# Patient Record
Sex: Female | Born: 1985 | Race: White | Hispanic: No | Marital: Married | Smoking: Current every day smoker
Health system: Southern US, Community
[De-identification: ages and names within clinical notes are randomized; demographics above are authoritative.]

## PROBLEM LIST (undated history)

## (undated) DIAGNOSIS — G43909 Migraine, unspecified, not intractable, without status migrainosus: Secondary | ICD-10-CM

## (undated) DIAGNOSIS — F909 Attention-deficit hyperactivity disorder, unspecified type: Secondary | ICD-10-CM

## (undated) HISTORY — PX: CHOLECYSTECTOMY: SHX55

## (undated) HISTORY — PX: TUBAL LIGATION: SHX77

---
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
## 2004-09-17 ENCOUNTER — Emergency Department (HOSPITAL_COMMUNITY): Admission: EM | Admit: 2004-09-17 | Discharge: 2004-09-18 | Payer: Self-pay | Admitting: Emergency Medicine

## 2005-01-11 ENCOUNTER — Emergency Department (HOSPITAL_COMMUNITY): Admission: EM | Admit: 2005-01-11 | Discharge: 2005-01-11 | Payer: Self-pay | Admitting: Emergency Medicine

## 2007-02-09 ENCOUNTER — Other Ambulatory Visit: Admission: RE | Admit: 2007-02-09 | Discharge: 2007-02-09 | Payer: Self-pay | Admitting: Obstetrics and Gynecology

## 2007-07-25 ENCOUNTER — Inpatient Hospital Stay (HOSPITAL_COMMUNITY): Admission: AD | Admit: 2007-07-25 | Discharge: 2007-07-25 | Payer: Self-pay | Admitting: Family Medicine

## 2007-07-25 ENCOUNTER — Ambulatory Visit: Payer: Self-pay | Admitting: *Deleted

## 2007-08-13 ENCOUNTER — Inpatient Hospital Stay (HOSPITAL_COMMUNITY): Admission: AD | Admit: 2007-08-13 | Discharge: 2007-08-13 | Payer: Self-pay | Admitting: Obstetrics & Gynecology

## 2007-08-13 ENCOUNTER — Ambulatory Visit: Payer: Self-pay | Admitting: Physician Assistant

## 2007-08-25 ENCOUNTER — Other Ambulatory Visit: Payer: Self-pay | Admitting: Obstetrics and Gynecology

## 2007-08-28 ENCOUNTER — Inpatient Hospital Stay (HOSPITAL_COMMUNITY): Admission: AD | Admit: 2007-08-28 | Discharge: 2007-08-31 | Payer: Self-pay | Admitting: Family Medicine

## 2007-08-28 ENCOUNTER — Ambulatory Visit: Payer: Self-pay | Admitting: Family Medicine

## 2007-10-17 ENCOUNTER — Emergency Department (HOSPITAL_COMMUNITY): Admission: EM | Admit: 2007-10-17 | Discharge: 2007-10-17 | Payer: Self-pay | Admitting: Emergency Medicine

## 2009-12-14 ENCOUNTER — Ambulatory Visit (HOSPITAL_COMMUNITY): Admission: RE | Admit: 2009-12-14 | Discharge: 2009-12-14 | Payer: Self-pay | Admitting: Internal Medicine

## 2010-07-16 NOTE — H&P (Signed)
NAME:  [name], [name]            ACCOUNT NO.:  [id number]   MEDICAL RECORD NO.:  [id number]          PATIENT TYPE:  INP   LOCATION:                                [facility]   PHYSICIAN:  Tilda Burrow, M.D. DATE OF BIRTH:  1986/02/13   DATE OF ADMISSION:  DATE OF DISCHARGE:                              HISTORY & PHYSICAL   ADMITTING DIAGNOSIS:  Pregnancy at [redacted] weeks gestation, repeat C-section,  not for trial of labor, elective permanent sterilization.   HISTORY OF PRESENT ILLNESS:  This 25 year old female gravida 3, para 2-0-  0-2, LMP September 29, 2006, plus [facility] September 06, 2007, was admitted at [redacted] weeks  gestation for repeat low-transverse cervical cesarean section.  She also  desires permanent sterilization, tubal ligation has been reviewed.  The  patient is admitted for repeat C-section.  Prior cesarean section at 37  weeks was after a failed induction of labor for pregnancy-induced  hypertension.  That C-section was complicated by severely impacted head  into the pelvis with extension of the incision upward performed  surgically and an inferior laceration of the cervix occurring as well.  This was all repaired without any additional sequelae, but has noted for  planning the surgery at this time.   PAST MEDICAL HISTORY:  Benign.   SURGICAL HISTORY:  Negative.   ALLERGIES:  None other than PENICILLIN, questionable PENICILLIN allergy  causing a rash.  We intent to use beta-lactam antibiotic at the time of  surgery.  She has no allergies to latex.   PRIOR SURGERIES:  C-section in 2003 at [facility], in 2008 in  [place].   Pupils equal, round, and reactive.  Extraocular movements intact.  Neck  supple.  Chest clear to auscultation.  Abdomen gravid uterus 38-cm  fundal height, vertex presentation, cervix long, closed at last check  August 23, 2007.   PLAN:  A repeat C-section at 11:30 a.m. on August 30, 2007.      Tilda Burrow, M.D.  Electronically  Signed     JVF/MEDQ  D:  08/24/2007  T:  08/25/2007  Job:  [id number]

## 2010-07-16 NOTE — Op Note (Signed)
[name], [name]            ACCOUNT NO.:  [id number]   MEDICAL RECORD NO.:  [id number]          PATIENT TYPE:  INP   LOCATION:  9199                          [facility]ACILITY:  [facility]   PHYSICIAN:  Tanya S. Shawnie Pons, M.D.   DATE OF BIRTH:  Jul 09, 1985   DATE OF PROCEDURE:  08/29/2007  DATE OF DISCHARGE:                               OPERATIVE REPORT   PREOPERATIVE DIAGNOSES:  1. Intrauterine pregnancy at 38 plus weeks, active labor.  2. Previous C-section x2.  3. Desires sterility.   POSTOPERATIVE DIAGNOSES:  1. Intrauterine pregnancy at 38 plus weeks, active labor.  2. Previous C-section x2.  3. Desires sterility.   PROCEDURE:  Repeat low-transverse cesarean section and bilateral tubal  ligation with Filshie clip.   SURGEON:  Shelbie Proctor. Shawnie Pons, MD   ASSISTANT:  None.   ANESTHESIA:  Spinal local, Dr. Angelica Pou.   FINDINGS:  Viable female infant, Apgars 9 and 9, and weight 6 pounds 14  ounces.   SPECIMENS:  Placenta to labor and delivery.   ESTIMATED BLOOD LOSS:  1000 mL.   COMPLICATIONS:  None known.   REASON FOR PROCEDURE:  Briefly, the patient is a 25 year old gravida 3,  para 2 who is at 63 weeks' and was scheduled for repeat C-section on  Monday who came in an active labor overnight.  She was watched for some  time and change her cervix from 1-3 and so was taken for delivery.  The  patient has a history of 2 prior C-sections.   PROCEDURE:  The patient was taken to the OR where she was placed in  supine position left lateral tilt.  She was prepped and draped in usual  sterile fashion.  A Foley catheter placed at the bladder.  When  anesthesia was felt to be adequate, Pfannenstiel made through old scar.  This incision was carried down to the underlying fascia, which was  divided in the midline.  The fascia was dissected off the rectus muscles  superiorly and inferiorly, bluntly laterally and sharply in the midline.  The peritoneal cavity was then entered high.  The  bladder was noted to  be high also.  More room was created by traversing the rectus muscle  anteriorly and there was some omentum attached there and this was taken  down.  Alexis retractor was then placed inside the incision.  A low-  transverse incision was made on the uterus above the bladder.  The  incision was carried down to amniotic cavity, which was entered sharply.  Clear fluid was noted.  The uterine incision was extended bluntly  laterally.  The infant was vertex.  There was a nuchal cord times 1.  Infant was delivered out of the incision easily.  Infant was bulb  suctioned on the abdomen.  Spontaneous cry was heard.  Cord was clamped  x2 and taken to awaiting peds.  Cord blood was obtained.  Placenta was  delivered from the uterus.  The uterine cavity cleaned with a dry lap  pad.  Edges of the uterine incisions were grasped with ring forceps.  The uterine incision closed with  a locked running 0 Vicryl suture.  Excellent hemostasis was noted.  Attention was turned to the tubes.  The  patient's left tube was grasped with a Babcock clamp followed to its  fimbriated end.  A Filshie clip was placed approximately 2-cm from the  cornua.  Similarly, the right tube was grasped with a Babcock clamp  followed to its fimbriated end and Filshie clip placed 2 cm from the  cornu of well.  Inspection of the uterine incision showed the peritoneal  edge or serosal edge bleeding, which was cauterized with electrocautery.  Otherwise , hemostasis was adequate.  All Jon Gills was removed from the  abdomen.  The fascia was closed with a 0 Vicryl suture in a running  fashion.  Subcutaneous tissue was irrigated.  Any bleeders were  cauterized with electrocautery.  Skin closed using clips.  A 30 mL 0.25%  Marcaine were injected about the incision.  A pressure dressing was  applied.  All instrument, lap counts correct x2.  The patient was awake  and taken to recovery room in stable condition.  Infant went to  NICU,  also stable.      Shelbie Proctor. Shawnie Pons, M.D.  Electronically Signed     TSP/MEDQ  D:  08/29/2007  T:  08/29/2007  Job:  [id number]

## 2010-07-19 NOTE — Op Note (Signed)
[name], [name]                    ACCOUNT NO.:  [id number]   MEDICAL RECORD NO.:  [id number]                   PATIENT TYPE:  AMB   LOCATION:  DAY                                  FACILITY:  [facility]   PHYSICIAN:  Dalia Heading, M.D.               DATE OF BIRTH:  10-May-1985   DATE OF PROCEDURE:  DATE OF DISCHARGE:                                 OPERATIVE REPORT   PREOPERATIVE DIAGNOSIS:  Cholecystitis, cholelithiasis.   POSTOPERATIVE DIAGNOSIS:  Cholecystitis, cholelithiasis.   PROCEDURE:  Laparoscopic cholecystectomy   SURGEON:  Dalia Heading, M.D.   ASSISTANT:  Bernerd Limbo. Leona Carry, M.D.   ANESTHESIA:  General endotracheal   INDICATIONS:  The patient is a 25 year old white female who is 6 weeks  postpartum who presents with acute cholecystitis secondary to  cholelithiasis.  Her initial blood tests showed a total bilirubin of 9  though a repeat today shows total bilirubin of 0.4.  It is suspected that  her labs done 2 days ago were in error. Ultrasound reveals cholelithiasis  with a normal common bile duct.  The patient now comes to the operating room  for a laparoscopic cholecystectomy.  The risks and benefits of the procedure  including bleeding, infection, hepatobiliary injury and the possibility of  an open procedure were fully explained to the patient, and mother, who gave  informed consent for the patient as the patient is a minor.   DESCRIPTION OF PROCEDURE:  The patient was placed in the supine position.  After induction of general endotracheal anesthesia, the abdomen was prepped  and draped using the usual sterile technique with Betadine.  Surgical site  confirmation was performed.   An infraumbilical incision was made down to the fascia.  A Veress needle was  introduced into the abdominal cavity and confirmation of placement was done  using the saline drop test.  The abdomen was then insufflated to 16 mmHg  pressure.  An 11-mm trocar was introduced  into the abdominal cavity under  direct visualization without difficulty.  The patient was placed then in  reverse Trendelenburg position and an additional 11-mm trocar was placed in  the epigastric region and 5-mm trocars were placed in the right upper  quadrant and right flank regions. The liver was inspected and noted to be  within normal limits.  The gallbladder was retracted superiorly and  laterally.  The dissection was begun around the infundibulum of the  gallbladder.   The cystic duct was first identified.  Its junction to the infundibulum  fully identified.  Endoclips were placed proximally and distally on the  cystic duct; and the cystic duct was divided.  This was likewise done on the  cystic artery.  The gallbladder was then freed away from the gallbladder  fossa using Bovie electrocautery.  The gallbladder was delivered through the  epigastric trocar site using an EndoCatch bag without difficulty.  The  gallbladder fossa was inspected  and no abnormal bleeding or bile leakage was  noted.  Surgicel was placed in the gallbladder fossa.  All fluid and air  were then evacuated from the abdominal cavity prior to removal of the  trocars.   All wounds were irrigated with normal saline.  All wounds were injected with  0.5% Sensorcaine.  The infraumbilical fascia were reapproximated using an #0  Vicryl interrupted suture. All skin incisions were closed using staples.  Betadine ointment and dry sterile dressings were applied.   All tape and needle counts correct at the end of the procedure.  The patient  was extubated in the operating room and went back to recovery room in awake  and stable condition.   COMPLICATIONS:  None.   SPECIMENS:  Gallbladder with stones.   BLOOD LOSS:  Minimal.                                               Dalia Heading, M.D.    MAJ/MEDQ  D:  02/16/2002  T:  02/16/2002  Job:  [id number]   cc:   Mila Homer. Sudie Bailey, M.D.  [address] [place], [place] [address]  Fax: [phone]   Tilda Burrow, M.D.  [address] [place]  [place] [address]  Fax: [phone]

## 2010-07-19 NOTE — H&P (Signed)
[name], [name]                      ACCOUNT NO.:  [id number]   MEDICAL RECORD NO.:  [id number]                   PATIENT TYPE:   LOCATION:                                       FACILITY:  [facility]   PHYSICIAN:  Lazaro Arms, M.D.                DATE OF BIRTH:  1986/02/23   DATE OF ADMISSION:  DATE OF DISCHARGE:                                HISTORY & PHYSICAL   HISTORY OF PRESENT ILLNESS:  The patient is a 25 year old white female  gravida 1, para 0, abortus 0 with estimated date of delivery of January 21, 2002 by a sure last menstrual period and confirmatory 20 week sonogram  currently at 66 and 1/[redacted] weeks gestation.  The patient came into the hospital  a couple days ago complaining of diminished fetal movement.  Had a  beautifully reactive NST, but was found to have an elevated blood pressure.  I actually kept her overnight for observation and her blood pressure settled  down.  I did a 24-hour urine and laboratories.  All of her laboratories were  fine.  Her 24-hour urine also came back with only 127 mg of protein in 24  hours.  She comes in this morning as scheduled by me for a repeat prenatal  visit.  Her blood pressure is 140/110 left, 140/100 in the right and her  protein is negative.  She has 1+ edema.  DTRs are fine.  Her examination is  otherwise fine.  Her cervix is 2 cm, 50% effaced, soft, -1 station, mid  plane, and vertex.  Discussed with Dr. Emelda Fear who agrees that delivery  would be appropriate.  As a result, she is sent over to [facility]  for Pitocin induction of labor.   PAST MEDICAL HISTORY:  Negative.   PAST SURGICAL HISTORY:  Negative.   PAST OB:  She is nulliparous.   ALLERGIES:  None.   MEDICATIONS:  Vitamins.   REVIEW OF SYSTEMS:  Otherwise negative.   LABORATORIES:  Blood type O+.  Antibody screen is negative.  Serology is  nonreactive.  HIV is negative.  Hepatitis B is negative.  Rubella is  equivocal.  Pap was normal.   GC/Chlamydia were negative.  AFP was normal.  Glucola was elevated 176 but her three hour GTT was normal.  She is group B  Strep positive.   PHYSICAL EXAMINATION:  HEENT:  Unremarkable.  NECK:  Thyroid is normal.  LUNGS:  Clear.  BREASTS:  Deferred.  ABDOMEN:  Fundal height is 35 cm.  PELVIC:  Cervix 2, 50, -1, vertex, soft, mid plane.  EXTREMITIES:  1+ edema.  NEUROLOGIC:  Grossly intact.    IMPRESSION:  1. Intrauterine pregnancy at [redacted] weeks gestation.  2. Gestational hypertension.   PLAN:  With favorable cervix patient admitted for induction of labor.  She  understands her increased risk of cesarean section because of induction but  with her  blood pressure we feel that is the most appropriate management.  She will be managed by Dr. Emelda Fear in the hospital.                                               Lazaro Arms, M.D.    Loraine Maple  D:  12/31/2001  T:  12/31/2001  Job:  [id number]

## 2010-07-19 NOTE — Op Note (Signed)
[name], [name]                      ACCOUNT NO.:  [id number]   MEDICAL RECORD NO.:  [id number]                   PATIENT TYPE:  INP   LOCATION:  A428                                 FACILITY:  [facility]   PHYSICIAN:  Tilda Burrow, M.D.              DATE OF BIRTH:  [birth year]   DATE OF PROCEDURE:  01/02/2002  DATE OF DISCHARGE:                                 OPERATIVE REPORT   PREOPERATIVE DIAGNOSES:  1. Pregnancy at 37 weeks.  2. Pregnancy-induced hypertension.  3. Cephalopelvic disproportion.   POSTOPERATIVE DIAGNOSES:  1. Pregnancy at 37 weeks.  2. Pregnancy-induced hypertension.  3. Cephalopelvic disproportion.   PROCEDURE:  Primary low transverse cervical cesarean section.   SURGEON:  Tilda Burrow, M.D.   ASSISTANT:  Pelvic CST.   ANESTHESIA:  Epidural, Ferguson/Waller.   ESTIMATED BLOOD LOSS:  500 cc.   COMPLICATIONS:  Severely impacted head into the pelvis necessitating  extension of the incision upward to allow elevation of the head, with  inferior laceration of the cervix as well.  The extension was approximately  2 cm cephalad by surgical incision and 2 cm inferior by laceration of the  lower uterine segment.   DESCRIPTION OF PROCEDURE:  The patient was taken to the operating room and  prepped and draped for lower abdominal surgery with epidural catheter  confirmed as effective.  A Pfannenstiel-type incision was performed and the  bladder flap developed onto the lower uterine segment, which was well-  developed.  The bladder flap was pulled inferiorly, a transverse uterine  made, and when at the level of approximately the fetal jaw, the vertex was  in the right occiput transverse position.  The flat symphysis pubis made it  technically challenging to get the surgeon's hand beneath the fetal vertex,  so an assistant from below pushed up on the head and further effort was made  to elevate the vertex.  We made a vertical T-type extension of the  incision  approximately 2 cm, which allowed [place] to push up onto the shoulder until the  seal was broken by the combination of shoulder elevation and head elevation.  Then the operator's hand could be slipped beneath the vertex, which was then  rotated in the incision and the infant delivered by fundal pressure.  The  baby also required a brief bit of vacuum extractor application for the  occiput to steer the vertex through the incision without difficulty.  There  was minimal traction required at this time.  The infant promptly, had bulb  suctioning of the pharynx performed.  Amniotic fluid was clear without  malodor.  The cord was clamped and the infant passed to Dr. Latrelle Dodrill, the  pediatrician in attendance.   The infant weighed 7.5 3.5 ounces, and Apgars were 8 and 9.   After delivery of the infant and cord blood sample collection, we proceeded  to deliver the placenta, [facility] presentation, and then irrigate  the uterus  with antibiotic solution, followed by interrupted closure of the inferior  and superior lacerations.  The superior T-cut in the low uterine segment was  approximately 2 cm in length and was closed without difficulty.  The  inferior laceration was more irregular, occurred just to the left of the  midline, and required running locking closure.  The Pfannenstiel incision  was then closed in a single-layer running locking closure with good  hemostasis.  The bladder flap was loosely reapproximated with 2-0 chromic.   The abdomen was irrigated with antibiotic solution and anterior peritoneum  closed with 2-0 chromic, the fascia closed with 0 Vicryl, the subcu tissue  was irrigated to confirm that it was hemostatic and closed with interrupted  2-0 plain.  Staple closure of the skin completed the procedure.  At the end  of the procedure, the patient was placed on her stretcher to leave the room  and the epidural catheter removed with tip visualized as intact.                                                 Tilda Burrow, M.D.    JVF/MEDQ  D:  01/02/2002  T:  01/03/2002  Job:  [id number]

## 2010-07-19 NOTE — Discharge Summary (Signed)
   NAMESHADARA, LOPEZ                      ACCOUNT NO.:  [id number]   MEDICAL RECORD NO.:  [id number]                   PATIENT TYPE:  INP   LOCATION:  A428                                 FACILITY:  [facility]   PHYSICIAN:  Lazaro Arms, M.D.                DATE OF BIRTH:  03/09/1985   DATE OF ADMISSION:  12/31/2001  DATE OF DISCHARGE:  01/04/2002                                 DISCHARGE SUMMARY   DISCHARGE DIAGNOSES:  1. Status post a cesarean section secondary to cephalopelvic disproportion.  2. Pregnancy induced hypertension.   PROCEDURES:  1. Induction of labor.  2. Cesarean section.   Please refer to the transcribed History and Physical for details of  admission to the hospital.   HOSPITAL COURSE:  The patient was admitted, underwent ripening with Cervidil  vaginal medicine.  She then underwent Pitocin induction which failed.  On  the evening of January 02, 2002 the patient underwent her primary cesarean  section for secondary arrest of dilatation and descent.  It was done under a  spinal anesthetic.  It went without difficulty.  Postoperatively the patient  did well.  Her blood pressure resolved.  She was discharged to home  postoperative day #2, tolerating a regular diet, voiding without symptoms,  ambulatory, passing flatus, and tolerating oral pain medicine.  She is to be  seen in the office as scheduled to have her staples removed and Steri-Strips  placed.                                               Lazaro Arms, M.D.    Loraine Maple  D:  02/10/2002  T:  02/10/2002  Job:  [id number]

## 2010-07-19 NOTE — Discharge Summary (Signed)
NAMECARMELITE, [name]            ACCOUNT NO.:  [id number]   MEDICAL RECORD NO.:  [id number]          PATIENT TYPE:  INP   LOCATION:  9106                          [facility]ACILITY:  [facility]   PHYSICIAN:  Allie Bossier, MD        DATE OF BIRTH:  [birth year]   DATE OF ADMISSION:  08/28/2007  DATE OF DISCHARGE:  08/31/2007                               DISCHARGE SUMMARY   CONDITION:  Stable.   DISPOSITION:  Home.   DIET:  As tolerated.   MEDICATIONS:  1. Ibuprofen 800 mg one p.o. q.8 h.  2. Percocet 325/5 mg one p.o. q.4 h. p.r.n. pain, #30, no refills.  3. Wellbutrin 150 mg one p.o. daily.   ACTIVITY:  Pelvic rest for 6 weeks.   PROCEDURES:  During her stay will be a repeat low transverse cesarean  section and permanent sterilization via Filshie clip application by Dr.  Shawnie Pons.   HISTORY OF PRESENT ILLNESS AND HOSPITAL COURSE:  Ms. [name] is a 26-  year-old G3, P2 at 55.5 weeks estimated gestational age.  She was  scheduled to have a repeat C-section with Dr. Emelda Fear (her primary  doctor) at 50 weeks' estimated gestational age.  She was also for a  tubal ligation as she declined alternative forms of birth control and  wishes permanent sterility; however, she came in labor late on the  evening of the 27th and on the early morning hours of August 29, 2007, she  had a repeat low transverse cesarean section and Filshie clip  application.  Her surgery was uncomplicated.  Her blood loss was  approximately a liter.  Postoperatively, she did well.  She was  continued on her Wellbutrin as she has taken at home.  Her hemoglobin on  the 29th was 10.5 and hematocrit of 29.9.  This was on postoperative day  #1.  Her vitals were stable.  She was afebrile.  She was tolerating p.o.  She is voiding without dysuria.  She is ambulating.  Physical exam  showed her incision to be clean, dry, and intact with no evidence of an  infection.  Her staples were removed and replaced with Steri-Strips.  She will  follow up with Dr. Emelda Fear for her postop care in 6 weeks.      Allie Bossier, MD  Electronically Signed     MCD/MEDQ  D:  09/23/2007  T:  09/24/2007  Job:  [id number]

## 2010-07-19 NOTE — Op Note (Signed)
   [name], [name]                      ACCOUNT NO.:  [id number]   MEDICAL RECORD NO.:  [id number]                   PATIENT TYPE:  INP   LOCATION:  A428                                 FACILITY:  [facility]   PHYSICIAN:  Tilda Burrow, M.D.              DATE OF BIRTH:  [birth year]   DATE OF PROCEDURE:  01/02/2002  DATE OF DISCHARGE:                                 OPERATIVE REPORT   TIME:  8:45 a.m.   PROCEDURE:  Epidural catheter placement.   SURGEON:  Tilda Burrow, M.D.   DESCRIPTION OF PROCEDURE:  The patient has made slow progress through the  night. Pitocin induction was begun at 6 a.m.  She is now on 6  milliunits/minute and epidural has been requested.  The patient understands  its principals and potential for analgesic relief.   The patient was placed in the sitting position and flexed forward with L2-3  interspace identified and the back prepped and draped.  Loss of resistance  technique was used after 1% Xylocaine is used to raise a skin wheal of  analgesia.  The Touhy needle is used, 19 gauge, and epidural space is  identified using loss of resistance technique at 4.5 cm depth.  The epidural  catheter is easily threaded to 3 cm after a 5 cc test dose of 1.5% Xylocaine  with epinephrine is instilled.  The patient has the catheter taped in place  and connected to epidural infusion at 12 cc per hour.  No additional bolus  is administered.  The patient has good analgesic relief.  Cervical exam  shows the cervix to be 375 minus 2 or higher with amniotomy performed.  Presenting part seems to be having difficulty __________ the true pelvis. We  will progress with Pitocin induction at this time with monitoring for  arrested labor.                                               Tilda Burrow, M.D.    JVF/MEDQ  D:  01/02/2002  T:  01/03/2002  Job:  [id number]

## 2010-07-19 NOTE — H&P (Signed)
[name], [name]                    ACCOUNT NO.:  [id number]   MEDICAL RECORD NO.:  [id number]                   PATIENT TYPE:  OUT   LOCATION:  RAD                                  FACILITY:  [facility]   PHYSICIAN:  Dalia Heading, M.D.               DATE OF BIRTH:  07/16/1985   DATE OF ADMISSION:  02/15/2002  DATE OF DISCHARGE:                                HISTORY & PHYSICAL   CHIEF COMPLAINT:  Cholecystitis, cholelithiasis and jaundice.   HISTORY OF PRESENT ILLNESS:  The patient is a 25 year old, white female who  was referred for evaluation and treatment of cholecystitis secondary to  cholelithiasis.  She is six weeks postpartum from a C-section.  She has been  having right upper quadrant abdominal pain, nausea, indigestion, vomiting  and fatty food intolerance for the past month.  She denies fever, chills and  history of peptic ulcer disease.  Her skin has been slightly yellow.   PAST MEDICAL HISTORY:  As noted in history of present illness.  She states  that she did not have nausea or vomiting during her pregnancy.   PAST SURGICAL HISTORY:  C-section six weeks ago.   MEDICATIONS:  1. Advil.  2. Birth control patch.   ALLERGIES:  PENICILLIN.   REVIEW OF SYMPTOMS:  The patient does smoke a pack of cigarettes per day.  The patient denies alcohol use.  She denies any other cardiac, pulmonary or  bleeding disorders.   PHYSICAL EXAMINATION:  GENERAL:  The patient is well-developed, well-  nourished, white female in no acute distress.  VITAL SIGNS:  Afebrile. Vital signs stable.  HEENT:  No scleral icterus.  LUNGS:  Clear to auscultation with equal breath sounds bilaterally.  HEART:  Regular rate and rhythm without S3, S4 or murmurs.  ABDOMEN:  Soft, nondistended.  She is tender in the right upper quadrant to  palpation.  No hepatosplenomegaly, masses, rigidity or hernias are noted.   LABORATORY DATA AND X-RAY FINDINGS:  Ultrasound of the gallbladder reveals  cholelithiasis with a thickened gallbladder wall.  Normal common bile duct  is found.  White blood cell count is within normal limits.  Total bilirubin  is elevated at 9.1, direct bilirubin 6.7.   IMPRESSION:  Cholecystitis, cholelithiasis and jaundice.   PLAN:  The patient is scheduled for laparoscopic cholecystectomy with  cholangiograms on February 16, 2002.  The risks and benefits of the  procedure including bleeding, infection, hepatobiliary injury, the  possibility of an open procedure were fully explained to the patient and  mother who had given informed consent.                                               Dalia Heading, M.D.    MAJ/MEDQ  D:  02/15/2002  T:  02/15/2002  Job:  [id number]   cc:   Dalia Heading, M.D.  [address] [place]  [place] [address]  Fax: [phone]

## 2010-11-27 LAB — URINALYSIS, ROUTINE W REFLEX MICROSCOPIC
Bilirubin Urine: NEGATIVE
Glucose, UA: NEGATIVE
Hgb urine dipstick: NEGATIVE
Ketones, ur: NEGATIVE
Nitrite: NEGATIVE
Protein, ur: NEGATIVE
Specific Gravity, Urine: 1.025
Urobilinogen, UA: 0.2
pH: 6

## 2010-11-28 LAB — CBC
HCT: 29.9 — ABNORMAL LOW
HCT: 36.6
Hemoglobin: 10.5 — ABNORMAL LOW
Hemoglobin: 12.9
MCHC: 35.1
MCV: 92.7
Platelets: 198
Platelets: 217
RBC: 3.95
RDW: 12.6
RDW: 12.7
WBC: 12.4 — ABNORMAL HIGH
WBC: 12.6 — ABNORMAL HIGH

## 2010-11-28 LAB — URINALYSIS, ROUTINE W REFLEX MICROSCOPIC
Bilirubin Urine: NEGATIVE
Glucose, UA: NEGATIVE
Hgb urine dipstick: NEGATIVE
Ketones, ur: NEGATIVE
Nitrite: NEGATIVE
Protein, ur: NEGATIVE
Specific Gravity, Urine: 1.015
Urobilinogen, UA: 0.2
pH: 6

## 2010-11-28 LAB — RPR: RPR Ser Ql: NONREACTIVE

## 2012-10-09 ENCOUNTER — Encounter (HOSPITAL_COMMUNITY): Payer: Self-pay | Admitting: *Deleted

## 2012-10-09 ENCOUNTER — Emergency Department (HOSPITAL_COMMUNITY)
Admission: EM | Admit: 2012-10-09 | Discharge: 2012-10-09 | Disposition: A | Discharge status: Home / Self Care | Payer: 59 | Attending: Emergency Medicine | Admitting: Emergency Medicine

## 2012-10-09 DIAGNOSIS — M79609 Pain in unspecified limb: Secondary | ICD-10-CM | POA: Insufficient documentation

## 2012-10-09 DIAGNOSIS — R519 Headache, unspecified: Secondary | ICD-10-CM

## 2012-10-09 DIAGNOSIS — F17200 Nicotine dependence, unspecified, uncomplicated: Secondary | ICD-10-CM | POA: Insufficient documentation

## 2012-10-09 DIAGNOSIS — IMO0001 Reserved for inherently not codable concepts without codable children: Secondary | ICD-10-CM | POA: Insufficient documentation

## 2012-10-09 DIAGNOSIS — F909 Attention-deficit hyperactivity disorder, unspecified type: Secondary | ICD-10-CM | POA: Insufficient documentation

## 2012-10-09 DIAGNOSIS — Z79899 Other long term (current) drug therapy: Secondary | ICD-10-CM | POA: Insufficient documentation

## 2012-10-09 DIAGNOSIS — R51 Headache: Secondary | ICD-10-CM | POA: Insufficient documentation

## 2012-10-09 DIAGNOSIS — R110 Nausea: Secondary | ICD-10-CM

## 2012-10-09 DIAGNOSIS — G43909 Migraine, unspecified, not intractable, without status migrainosus: Secondary | ICD-10-CM | POA: Insufficient documentation

## 2012-10-09 DIAGNOSIS — M542 Cervicalgia: Secondary | ICD-10-CM | POA: Insufficient documentation

## 2012-10-09 DIAGNOSIS — H53149 Visual discomfort, unspecified: Secondary | ICD-10-CM | POA: Insufficient documentation

## 2012-10-09 DIAGNOSIS — Z9189 Other specified personal risk factors, not elsewhere classified: Secondary | ICD-10-CM

## 2012-10-09 DIAGNOSIS — R6883 Chills (without fever): Secondary | ICD-10-CM | POA: Insufficient documentation

## 2012-10-09 HISTORY — DX: Attention-deficit hyperactivity disorder, unspecified type: F90.9

## 2012-10-09 HISTORY — DX: Migraine, unspecified, not intractable, without status migrainosus: G43.909

## 2012-10-09 MED ORDER — METHOCARBAMOL 500 MG PO TABS: ORAL_TABLET | ORAL | Status: DC

## 2012-10-09 MED ORDER — ONDANSETRON 4 MG PO TBDP: 4.0000 mg | ORAL_TABLET | Freq: Three times a day (TID) | ORAL | Status: DC | PRN

## 2012-10-09 MED ORDER — DOXYCYCLINE HYCLATE 100 MG IV SOLR
100.0000 mg | Freq: Two times a day (BID) | INTRAVENOUS | Status: DC
  Administered 2012-10-09: 100 mg via INTRAVENOUS
  Filled 2012-10-09 (×3): qty 100

## 2012-10-09 MED ORDER — KETOROLAC TROMETHAMINE 30 MG/ML IJ SOLN
30.0000 mg | Freq: Once | INTRAMUSCULAR | Status: AC
  Administered 2012-10-09: 30 mg via INTRAVENOUS
  Filled 2012-10-09: qty 1

## 2012-10-09 MED ORDER — METOCLOPRAMIDE HCL 5 MG/ML IJ SOLN
10.0000 mg | Freq: Once | INTRAMUSCULAR | Status: AC
  Administered 2012-10-09: 10 mg via INTRAVENOUS
  Filled 2012-10-09: qty 2

## 2012-10-09 MED ORDER — DIPHENHYDRAMINE HCL 50 MG/ML IJ SOLN
25.0000 mg | Freq: Once | INTRAMUSCULAR | Status: AC
  Administered 2012-10-09: 25 mg via INTRAVENOUS
  Filled 2012-10-09: qty 1

## 2012-10-09 MED ORDER — DOXYCYCLINE HYCLATE 100 MG PO CAPS: 100.0000 mg | ORAL_CAPSULE | Freq: Two times a day (BID) | ORAL | Status: DC

## 2012-10-09 MED ORDER — SODIUM CHLORIDE 0.9 % IV BOLUS (SEPSIS)
1000.0000 mL | Freq: Once | INTRAVENOUS | Status: AC
  Administered 2012-10-09: 1000 mL via INTRAVENOUS

## 2012-10-09 MED ORDER — NAPROXEN 500 MG PO TABS: 500.0000 mg | ORAL_TABLET | Freq: Two times a day (BID) | ORAL | Status: DC | PRN

## 2012-10-09 NOTE — ED Provider Notes (Signed)
CSN: [id number]     Arrival date & time 10/09/12  1111 History     First MD Initiated Contact with Patient 10/09/12 1128     Chief Complaint  Patient presents with  . Insect Bite   (Consider location/radiation/quality/duration/timing/severity/associated sxs/prior Treatment) HPI  Pt relates she and her family go fishing a lot and are outside a lot and they frequently find ticks on themselves. She removed a tick on 8/6 on her lower abdomen. She started getting some neck pain that evening and states she also started getting pain in her right hand which she's had in the past but not for a a while. She also started getting diffuse myalgias and got a headache yesterday. She has a history of chronic migraines but this headache is a little bit different. It is behind her eyes and also hold cranial. Is steady without being throbbing. Nothing makes it feel better, nothing makes it feel worse. She states she has minimal photophobia and noise sensitivity unlike when she has her migraines. She's had nausea without vomiting. She denies rash or fever. She has had chills. She reports nobody else at home is been sick. She has had some mild diarrhea.  PCP Dr Phillips Odor  Past Medical History  Diagnosis Date  . ADHD (attention deficit hyperactivity disorder)   . Migraine    Past Surgical History  Procedure Laterality Date  . Cesarean section    . Cholecystectomy     No family history on file. History  Substance Use Topics  . Smoking status: Current Every Day Smoker  . Smokeless tobacco: Not on file  . Alcohol Use: No   employed   OB History   Grav Para Term Preterm Abortions TAB SAB Ect Mult Living                 Review of Systems  All other systems reviewed and are negative.    Allergies  Review of patient's allergies indicates no known allergies.  Home Medications   Current Outpatient Rx  Name  Route  Sig  Dispense  Refill  . amphetamine-dextroamphetamine (ADDERALL XR) 20 MG 24 hr  capsule   Oral   Take 20 mg by mouth 2 (two) times daily.         Marland Kitchen amphetamine-dextroamphetamine (ADDERALL) 10 MG tablet   Oral   Take 10 mg by mouth daily.         Marland Kitchen ibuprofen (ADVIL,MOTRIN) 200 MG tablet   Oral   Take 800 mg by mouth every 6 (six) hours as needed for pain or fever.         . topiramate (TOPAMAX) 25 MG capsule   Oral   Take 25 mg by mouth 2 (two) times daily.          BP 141/98  Pulse 104  Temp(Src) 98.5 F (36.9 C) (Oral)  Resp 20  Ht 5\' 3"  (1.6 m)  Wt 157 lb (71.215 kg)  BMI 27.82 kg/m2  SpO2 100%  LMP 09/26/2012  Vital signs normal except tachycardia  Physical Exam  Nursing note and vitals reviewed. Constitutional: She is oriented to person, place, and time. She appears well-developed and well-nourished.  Non-toxic appearance. She does not appear ill. No distress.  HENT:  Head: Normocephalic and atraumatic.  Right Ear: External ear normal.  Left Ear: External ear normal.  Nose: Nose normal. No mucosal edema or rhinorrhea.  Mouth/Throat: Oropharynx is clear and moist and mucous membranes are normal. No dental abscesses or edematous.  Eyes: Conjunctivae and EOM are normal. Pupils are equal, round, and reactive to light.  Neck: Normal range of motion and full passive range of motion without pain. Neck supple.  Has discomfort on flexion/ext but no nuchal rigidity  Cardiovascular: Normal rate, regular rhythm and normal heart sounds.  Exam reveals no gallop and no friction rub.   No murmur heard. Pulmonary/Chest: Effort normal and breath sounds normal. No respiratory distress. She has no wheezes. She has no rhonchi. She has no rales. She exhibits no tenderness and no crepitus.  Abdominal: Soft. Normal appearance and bowel sounds are normal. She exhibits no distension. There is no tenderness. There is no rebound and no guarding.    Area of tick bite noted. Pt has the tick in a baggy, it is a small deer tick  Musculoskeletal: Normal range of  motion. She exhibits no edema and no tenderness.  Moves all extremities well.   Neurological: She is alert and oriented to person, place, and time. She has normal strength. No cranial nerve deficit.  Skin: Skin is warm, dry and intact. No rash noted. No erythema. No pallor.  No rash on extremities, trunk, palms, soles  Psychiatric: She has a normal mood and affect. Her speech is normal and behavior is normal. Her mood appears not anxious.    ED Course   Medications  doxycycline (VIBRAMYCIN) 100 mg in dextrose 5 % 250 mL IVPB (100 mg Intravenous New Bag/Given 10/09/12 1357)  sodium chloride 0.9 % bolus 1,000 mL (1,000 mLs Intravenous New Bag/Given 10/09/12 1357)  metoCLOPramide (REGLAN) injection 10 mg (10 mg Intravenous Given 10/09/12 1402)  ketorolac (TORADOL) 30 MG/ML injection 30 mg (30 mg Intravenous Given 10/09/12 1402)  diphenhydrAMINE (BENADRYL) injection 25 mg (25 mg Intravenous Given 10/09/12 1402)     Procedures (including critical care time)  Recheck 15:00 Headache gone, just starting her antibiotics.   Results for orders placed during the hospital encounter of 10/09/12  OCCULT BLOOD, POC DEVICE      Result Value Range   Fecal Occult Bld NEGATIVE  NEGATIVE   WRONG PATIENT    1. At high risk for tick borne illness   2. Nausea   3. Headache     New Prescriptions   DOXYCYCLINE (VIBRAMYCIN) 100 MG CAPSULE    Take 1 capsule (100 mg total) by mouth 2 (two) times daily.   METHOCARBAMOL (ROBAXIN) 500 MG TABLET    Take 1 or 2 po Q 6hrs for muscle pain   NAPROXEN (NAPROSYN) 500 MG TABLET    Take 1 tablet (500 mg total) by mouth 2 (two) times daily as needed (pain).   ONDANSETRON (ZOFRAN ODT) 4 MG DISINTEGRATING TABLET    Take 1 tablet (4 mg total) by mouth every 8 (eight) hours as needed for nausea.    Plan discharge   Devoria Albe, MD, FACEP    MDM    Ward Givens, MD 10/09/12 1600

## 2012-10-09 NOTE — ED Notes (Signed)
Pt states that she removed a tick from lower abd area on 10/06/2012, has been having muscle, joint aches since then with nausea, headache,

## 2013-10-17 ENCOUNTER — Ambulatory Visit
Admission: RE | Admit: 2013-10-17 | Discharge: 2013-10-17 | Disposition: A | Discharge status: Home / Self Care | Payer: BC Managed Care – PPO | Source: Ambulatory Visit | Attending: Family Medicine | Admitting: Family Medicine

## 2013-10-17 ENCOUNTER — Other Ambulatory Visit: Payer: Self-pay | Admitting: Family Medicine

## 2013-10-17 DIAGNOSIS — R05 Cough: Secondary | ICD-10-CM

## 2013-10-17 DIAGNOSIS — R059 Cough, unspecified: Secondary | ICD-10-CM

## 2013-10-17 DIAGNOSIS — R062 Wheezing: Secondary | ICD-10-CM

## 2013-10-17 DIAGNOSIS — Z6831 Body mass index (BMI) 31.0-31.9, adult: Secondary | ICD-10-CM

## 2015-08-16 ENCOUNTER — Encounter: Payer: Self-pay | Admitting: "Endocrinology

## 2015-08-16 ENCOUNTER — Ambulatory Visit (INDEPENDENT_AMBULATORY_CARE_PROVIDER_SITE_OTHER): Payer: BLUE CROSS/BLUE SHIELD | Admitting: "Endocrinology

## 2015-08-16 VITALS — BP 125/84 | HR 86 | Ht 62.0 in | Wt 194.0 lb

## 2015-08-16 DIAGNOSIS — E2749 Other adrenocortical insufficiency: Secondary | ICD-10-CM

## 2015-08-16 NOTE — Progress Notes (Signed)
Subjective:    Patient ID: [name], female    DOB: 02/14/1986, PCP Purvis Kilts, MD   Past Medical History  Diagnosis Date  . ADHD (attention deficit hyperactivity disorder)   . Migraine    Past Surgical History  Procedure Laterality Date  . Cesarean section    . Cholecystectomy     Social History   Social History  . Marital Status: Legally Separated    Spouse Name: N/A  . Number of Children: N/A  . Years of Education: N/A   Social History Main Topics  . Smoking status: Current Every Day Smoker  . Smokeless tobacco: None  . Alcohol Use: No  . Drug Use: No  . Sexual Activity: Not Asked   Other Topics Concern  . None   Social History Narrative   Outpatient Encounter Prescriptions as of 08/16/2015  Medication Sig  . amphetamine-dextroamphetamine (ADDERALL XR) 30 MG 24 hr capsule Take 30 mg by mouth daily.  Marland Kitchen amphetamine-dextroamphetamine (ADDERALL) 20 MG tablet Take 20 mg by mouth daily.  . furosemide (LASIX) 20 MG tablet Take 20 mg by mouth.  . [DISCONTINUED] amphetamine-dextroamphetamine (ADDERALL XR) 20 MG 24 hr capsule Take 20 mg by mouth 2 (two) times daily.  . [DISCONTINUED] amphetamine-dextroamphetamine (ADDERALL) 10 MG tablet Take 10 mg by mouth daily.  . [DISCONTINUED] doxycycline (VIBRAMYCIN) 100 MG capsule Take 1 capsule (100 mg total) by mouth 2 (two) times daily.  . [DISCONTINUED] ibuprofen (ADVIL,MOTRIN) 200 MG tablet Take 800 mg by mouth every 6 (six) hours as needed for pain or fever.  . [DISCONTINUED] methocarbamol (ROBAXIN) 500 MG tablet Take 1 or 2 po Q 6hrs for muscle pain  . [DISCONTINUED] naproxen (NAPROSYN) 500 MG tablet Take 1 tablet (500 mg total) by mouth 2 (two) times daily as needed (pain).  . [DISCONTINUED] ondansetron (ZOFRAN ODT) 4 MG disintegrating tablet Take 1 tablet (4 mg total) by mouth every 8 (eight) hours as needed for nausea.  . [DISCONTINUED] topiramate (TOPAMAX) 25 MG capsule Take 25 mg by mouth 2 (two)  times daily.   No facility-administered encounter medications on file as of 08/16/2015.   ALLERGIES: No Known Allergies VACCINATION STATUS:  There is no immunization history on file for this patient.  HPI  30 year old female patient with medical history as above. She is being seen in consultation for hypercortisolemia requested by Dr. Hilma Favors. -She was being worked up due to recent weight gain as well as diffuse body swelling involving extremities. -She denies weight loss,  Nausea/ vomiting. - She has some exposure to inhaled steroids due to bronchitis. She denies exposure to large dose steroids by mouth. -She reports unquantified amount of weight gain in recent month. She denies family history of adrenal, pituitary dysfunction. -She has smoked relatively heavy since age 50.  Review of Systems Constitutional: + weight gain,  +fatigue, no subjective hyperthermia/hypothermia Eyes: no blurry vision, no xerophthalmia ENT: no sore throat, no nodules palpated in throat, no dysphagia/odynophagia, no hoarseness Cardiovascular: no CP/SOB/palpitations/leg swelling Respiratory: no cough/SOB Gastrointestinal: no N/V/D/C Musculoskeletal: + muscle/joint aches, and swelling Skin: no rashes Neurological: no tremors/numbness/tingling/dizziness Psychiatric: +anxiety  Objective:    BP 125/84 mmHg  Pulse 86  Ht 5\' 2"  (1.575 m)  Wt 194 lb (87.998 kg)  BMI 35.47 kg/m2  Wt Readings from Last 3 Encounters:  08/16/15 194 lb (87.998 kg)  10/09/12 157 lb (71.215 kg)    Physical Exam Constitutional: overweight, in NAD Eyes: PERRLA, EOMI, no exophthalmos ENT: moist mucous membranes,  no thyromegaly, no cervical lymphadenopathy Cardiovascular: RRR, No MRG Respiratory: CTA B Gastrointestinal: abdomen soft, NT, ND, BS+ Musculoskeletal: no deformities, strength intact in all 4 Skin: moist, warm, no rashes Neurological: no tremor with outstretched hands, DTR normal in all 4  Labs from 05/26/2015  : A.m. cortisol 3.8 (normal 6.2-19.4) P.m. cortisol 5.1 (normal 2.3-11.9  )    Assessment & Plan:   1. 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  - Her one time a.m. hypercortisolemia 3.8 is not sufficient to diagnose adrenal insufficiency . Her presentation does not indicate adrenal insufficiency although she has exposure to steroids mainly inhaled due to bronchitis. -I will proceed to obtain ACTH stimulation test and patient will return for reevaluation. -I had  a long discussion with the patient regarding the need to confirm or rule out adrenal insufficiency before committing for long-term steroid replacement. -I have extensively counseled her against smoking to decrease her risk of COPD which may necessitate use of steroids which puts her at risk for future adrenal dysfunction. -She has mild interphalangeal tenderness on bilateral hands. She may need a screening for rheumatoid arthritis if this continues to be an issue for her.   - I advised patient to maintain close follow up with Purvis Kilts, MD for primary care needs. Follow up plan: Return in about 2 weeks (around 08/30/2015) for follow up with pre-visit labs.  Glade Lloyd, MD Phone: [phone]  Fax: [phone]   08/16/2015, 4:07 PM

## 2015-08-24 ENCOUNTER — Telehealth: Payer: Self-pay

## 2015-08-24 NOTE — Telephone Encounter (Signed)
Called for Pre-auth for ACTH Stim Test. [employer] states no PA required. REF # [id number]

## 2015-08-27 ENCOUNTER — Encounter (HOSPITAL_COMMUNITY)
Admission: RE | Admit: 2015-08-27 | Discharge: 2015-08-27 | Disposition: A | Discharge status: Home / Self Care | Payer: BLUE CROSS/BLUE SHIELD | Source: Ambulatory Visit | Attending: "Endocrinology | Admitting: "Endocrinology

## 2015-08-27 DIAGNOSIS — E2749 Other adrenocortical insufficiency: Secondary | ICD-10-CM | POA: Diagnosis present

## 2015-08-27 LAB — ACTH STIMULATION, 3 TIME POINTS
CORTISOL 30 MIN: 19.3 ug/dL
Cortisol, 60 Min: 22.3 ug/dL
Cortisol, Base: 12.5 ug/dL

## 2015-08-27 MED ORDER — COSYNTROPIN 0.25 MG IJ SOLR
INTRAMUSCULAR | Status: AC
  Filled 2015-08-27: qty 0.25

## 2015-08-27 MED ORDER — COSYNTROPIN 0.25 MG IJ SOLR
0.2500 mg | Freq: Once | INTRAMUSCULAR | Status: AC
  Administered 2015-08-27: 0.25 mg via INTRAVENOUS

## 2015-08-27 NOTE — Progress Notes (Signed)
Results for [name], [name] (MRN [id number]) as of 08/27/2015 15:09  Ref. Range 08/27/2015 08:30  Cortisol, Base Latest Units: ug/dL 12.5  Cortisol, 30 Min Latest Units: ug/dL 19.3  Cortisol, 60 Min Latest Units: ug/dL 22.3

## 2015-08-31 ENCOUNTER — Ambulatory Visit (INDEPENDENT_AMBULATORY_CARE_PROVIDER_SITE_OTHER): Payer: BLUE CROSS/BLUE SHIELD | Admitting: "Endocrinology

## 2015-08-31 ENCOUNTER — Encounter: Payer: Self-pay | Admitting: "Endocrinology

## 2015-08-31 VITALS — BP 126/86 | HR 81 | Ht 62.0 in | Wt 195.0 lb

## 2015-08-31 DIAGNOSIS — E2749 Other adrenocortical insufficiency: Secondary | ICD-10-CM

## 2015-08-31 NOTE — Progress Notes (Signed)
Subjective:    Patient ID: [name], female    DOB: [birth year], PCP Purvis Kilts, MD   Past Medical History  Diagnosis Date  . ADHD (attention deficit hyperactivity disorder)   . Migraine    Past Surgical History  Procedure Laterality Date  . Cesarean section    . Cholecystectomy     Social History   Social History  . Marital Status: Legally Separated    Spouse Name: N/A  . Number of Children: N/A  . Years of Education: N/A   Social History Main Topics  . Smoking status: Current Every Day Smoker  . Smokeless tobacco: None  . Alcohol Use: No  . Drug Use: No  . Sexual Activity: Not Asked   Other Topics Concern  . None   Social History Narrative   Outpatient Encounter Prescriptions as of 08/31/2015  Medication Sig  . amphetamine-dextroamphetamine (ADDERALL XR) 30 MG 24 hr capsule Take 30 mg by mouth daily.  Marland Kitchen amphetamine-dextroamphetamine (ADDERALL) 20 MG tablet Take 20 mg by mouth daily.  . furosemide (LASIX) 20 MG tablet Take 20 mg by mouth.   No facility-administered encounter medications on file as of 08/31/2015.   ALLERGIES: No Known Allergies VACCINATION STATUS:  There is no immunization history on file for this patient.  HPI  30 year old female patient with medical history as above. She is being seen in f/u for hypercortisolemia requested by Dr. Hilma Favors. -She was being worked up due to recent weight gain as well as diffuse body swelling involving extremities. -She denies weight loss,  Nausea/ vomiting. - She has some exposure to inhaled steroids due to bronchitis. She denies exposure to large dose steroids by mouth. -She reports unquantified amount of weight gain in recent month. She denies family history of adrenal, pituitary dysfunction. -She has smoked relatively heavy since age 45. -Her ACTH stim elation test is unremarkable, see below.  Review of Systems Constitutional: + weight gain,  +fatigue, no subjective  hyperthermia/hypothermia Eyes: no blurry vision, no xerophthalmia ENT: no sore throat, no nodules palpated in throat, no dysphagia/odynophagia, no hoarseness Cardiovascular: no CP/SOB/palpitations/leg swelling Respiratory: no cough/SOB Gastrointestinal: no N/V/D/C Musculoskeletal: + muscle/joint aches, and swelling Skin: no rashes Neurological: no tremors/numbness/tingling/dizziness Psychiatric: +anxiety  Objective:    BP 126/86 mmHg  Pulse 81  Ht 5\' 2"  (1.575 m)  Wt 195 lb (88.451 kg)  BMI 35.66 kg/m2  Wt Readings from Last 3 Encounters:  08/31/15 195 lb (88.451 kg)  08/27/15 194 lb (87.998 kg)  08/16/15 194 lb (87.998 kg)    Physical Exam Constitutional: overweight, in NAD Eyes: PERRLA, EOMI, no exophthalmos ENT: moist mucous membranes, no thyromegaly, no cervical lymphadenopathy Cardiovascular: RRR, No MRG Respiratory: CTA B Gastrointestinal: abdomen soft, NT, ND, BS+ Musculoskeletal: no deformities, strength intact in all 4 Skin: moist, warm, no rashes Neurological: no tremor with outstretched hands, DTR normal in all 4  Labs from 05/26/2015 : A.m. cortisol 3.8 (normal 6.2-19.4) P.m. cortisol 5.1 (normal 2.3-11.9  )    Results for DINETTA, REDDIG (MRN [id number]) as of 08/31/2015 13:35  Ref. Range 08/27/2015 08:30  Cortisol, Base Latest Units: ug/dL 12.5  Cortisol, 30 Min Latest Units: ug/dL 19.3  Cortisol, 60 Min Latest Units: ug/dL 22.3    Assessment & Plan:   1. 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 She does not have adrenal insufficiency. She will not need steroids intervention at this point. -I will repeat a.m. cortisol and CMP in 1 year. She is  counseled extensively against smoking, which may put her at risk of  COPD which in turn may require steroids therapy with subsequent risk of adrenal insufficiency.   - I advised patient to maintain close follow up with Purvis Kilts, MD for primary care needs. Follow up  plan: Return in about 1 year (around 08/30/2016) for follow up with pre-visit labs.  Glade Lloyd, MD Phone: [phone]  Fax: [phone]   08/31/2015, 1:35 PM

## 2016-01-10 DIAGNOSIS — J45909 Unspecified asthma, uncomplicated: Secondary | ICD-10-CM | POA: Insufficient documentation

## 2016-01-10 DIAGNOSIS — F909 Attention-deficit hyperactivity disorder, unspecified type: Secondary | ICD-10-CM | POA: Insufficient documentation

## 2016-01-10 NOTE — Progress Notes (Signed)
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  History of Present Illness: [name] is a 30 y.o. female seen in consultation per request of [place]. According to patient about 1-1/2 year ago she started having experiencing pain and swelling in her hands feet and her face. She has seen several physicians at that location in the meantime. She states she was tried on antihypertensive medications and Lasix. She had to discontinue antihypertensive medications due to episodes of dizziness. She states her hands and feet swell she has difficulty walking due to feet swelling and recently she's been noticing swelling in her ankles as well. Now she is taking Lasix despite of that she's not noticed any improvement in her swelling. She also complains of fatigue and insomnia.  Activities of Daily Living:  Patient reports morning stiffness for 3 hours.   Patient Reports nocturnal pain.  Difficulty dressing/grooming: Reports Difficulty climbing stairs: Reports Difficulty getting out of chair: Reports Difficulty using hands for taps, buttons, cutlery, and/or writing: Reports   Review of Systems  Constitutional: Positive for fatigue, weight gain and weakness. Negative for night sweats and weight loss.  HENT: Negative for mouth sores, trouble swallowing, trouble swallowing, mouth dryness and nose dryness.   Eyes: Negative for pain, redness, visual disturbance and dryness.  Respiratory: Positive for shortness of breath. Negative for cough and difficulty breathing.   Cardiovascular: Negative for chest pain, palpitations, hypertension, irregular heartbeat and swelling in legs/feet.  Gastrointestinal: Negative for blood in stool, constipation and  diarrhea.  Endocrine: Negative for increased urination.  Genitourinary: Negative for vaginal dryness.  Musculoskeletal: Positive for arthralgias, joint pain, joint swelling, myalgias, morning stiffness and myalgias. Negative for muscle weakness and muscle tenderness.  Skin: Negative for color change, rash, hair loss, skin tightness, ulcers and sensitivity to sunlight.  Allergic/Immunologic: Negative for susceptible to infections.  Neurological: Negative for dizziness, memory loss and night sweats.  Hematological: Negative for swollen glands.  Psychiatric/Behavioral: Positive for sleep disturbance. Negative for depressed mood. The patient is nervous/anxious.     PMFS History:  Patient Active Problem List   Diagnosis Date Noted  . ADHD 01/10/2016  . Asthma 01/10/2016  . 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 ADHD (attention deficit hyperactivity disorder)   . Migraine     Family History  Problem Relation Age of Onset  . Diabetes Mother   . Hypertension Mother   . Asthma Mother   . Hypertension Father   . Hyperlipidemia Father   . Alcoholism Father   . Drug abuse Father   . Diabetes Maternal Grandmother   . Cancer Paternal Grandmother    Past Surgical History:  Procedure Laterality Date  . CESAREAN SECTION    . CHOLECYSTECTOMY    . TUBAL LIGATION     Social History   Social History Narrative  . No narrative on file     Objective: Vital Signs: BP (!) 141/97 (BP Location: Left Arm, Patient Position: Sitting, Cuff Size: Small)   Pulse 77   Resp 14   Ht 5' 3"  (1.6 m)   Wt 197  lb (89.4 kg)   LMP 12/25/2015   BMI 34.90 kg/m    Physical Exam  Constitutional: She is oriented to person, place, and time. She appears well-developed and well-nourished.  HENT:  Head: Normocephalic and atraumatic.  Eyes: Conjunctivae and EOM are normal.  Neck: Normal range of motion.  Cardiovascular: Normal rate, regular rhythm, normal heart sounds  and intact distal pulses.   Pulmonary/Chest: Effort normal and breath sounds normal.  Abdominal: Soft. Bowel sounds are normal.  Lymphadenopathy:    She has no cervical adenopathy.  Neurological: She is alert and oriented to person, place, and time.  Skin: Skin is warm and dry. Capillary refill takes less than 2 seconds.  Psychiatric: She has a normal mood and affect. Her behavior is normal.  Nursing note and vitals reviewed.    Musculoskeletal Exam: C-spine, thoracic spine, lumbar spine good range of motion she has mild tenderness over SI joint area bilaterally. Shoulder joints, elbow joints, wrist joints, MCPs, PIPs, DIPs of her hands were all good range of motion with no synovitis. Hip joints, knee joints, ankle joints, MTPs PIPs DIPs with no synovitis. She had generalized hyperalgesia fibromyalgia tender points were 18 out of 18 positive.  CDAI Exam: No CDAI exam completed.    Investigation: Findings:  07/26/2015 CBC WBC 11.2 CMP ALT 33 mildly elevated, ESR 4, TSH normal    Imaging: No results found.  Speciality Comments: No specialty comments available.    Procedures:  No procedures performed Allergies: Prednisone   Assessment / Plan: Visit Diagnoses: Multiple arthralgias, myalgias, fatigue and generalized pain most likely patient is fibromyalgia syndrome.  Pain in both hands: There was no synovitis on examination also obtain x-rays today.  Pain in bilateral knee joints no warmth swelling or effusion was noted. She's been having ongoing pain and discomfort in walking I will get x-rays to look for any underlying osteoarthritis.  Pain in bilateral feet she's having discomfort walking and gives history of intermittent swelling in her feet and ankles. I do not see any synovitis on examination. We will obtain x-rays today.   She has history of following medical problems for which she's been seeing PCP:  Attention deficit hyperactivity disorder (ADHD), unspecified ADHD  type  Uncomplicated asthma, unspecified asthma severity, unspecified whether persistent   - Plan: XR Hand 2 View Right, XR Hand 2 View Left,  I will obtain following labs to rule out any underlying inflammatory autoimmune causes. CK, ANA, Rheumatoid factor, Cyclic citrul peptide antibody, IgG, Hepatitis panel, acute, Protein electrophoresis, serum Pain in both feet - Plan: XR Foot 2 Views Left, XR Foot 2 Views Right  Chronic pain of both knees - Plan: XR KNEE 3 VIEW LEFT, XR KNEE 3 VIEW RIGHT  Other fatigue  Encounter for vitamin deficiency screening - Plan: VITAMIN D 25 Hydroxy (Vit-D Deficiency, Fractures)    Orders: Orders Placed This Encounter  Procedures  . XR Hand 2 View Right  . XR Hand 2 View Left  . XR Foot 2 Views Left  . XR Foot 2 Views Right  . XR KNEE 3 VIEW LEFT  . XR KNEE 3 VIEW RIGHT  . CK  . ANA  . Rheumatoid factor  . Cyclic citrul peptide antibody, IgG  . Hepatitis panel, acute  . Protein electrophoresis, serum  . VITAMIN D 25 Hydroxy (Vit-D Deficiency, Fractures)     Face-to-face time spent with patient was 70mnutes. 50% of time was spent in counseling and coordination of care.  Follow-Up Instructions: Return for  Arthralgias.      Bo Merino, MD, Julious Payer

## 2016-01-11 ENCOUNTER — Encounter: Payer: Self-pay | Admitting: Rheumatology

## 2016-01-11 ENCOUNTER — Ambulatory Visit (INDEPENDENT_AMBULATORY_CARE_PROVIDER_SITE_OTHER): Payer: Self-pay

## 2016-01-11 ENCOUNTER — Ambulatory Visit (INDEPENDENT_AMBULATORY_CARE_PROVIDER_SITE_OTHER): Payer: BLUE CROSS/BLUE SHIELD | Admitting: Rheumatology

## 2016-01-11 VITALS — BP 141/97 | HR 77 | Resp 14 | Ht 63.0 in | Wt 197.0 lb

## 2016-01-11 DIAGNOSIS — F909 Attention-deficit hyperactivity disorder, unspecified type: Secondary | ICD-10-CM | POA: Diagnosis not present

## 2016-01-11 DIAGNOSIS — J45909 Unspecified asthma, uncomplicated: Secondary | ICD-10-CM | POA: Diagnosis not present

## 2016-01-11 DIAGNOSIS — M25562 Pain in left knee: Secondary | ICD-10-CM | POA: Diagnosis not present

## 2016-01-11 DIAGNOSIS — M25561 Pain in right knee: Secondary | ICD-10-CM

## 2016-01-11 DIAGNOSIS — M79641 Pain in right hand: Secondary | ICD-10-CM | POA: Diagnosis not present

## 2016-01-11 DIAGNOSIS — M79642 Pain in left hand: Secondary | ICD-10-CM

## 2016-01-11 DIAGNOSIS — Z1321 Encounter for screening for nutritional disorder: Secondary | ICD-10-CM | POA: Diagnosis not present

## 2016-01-11 DIAGNOSIS — M79672 Pain in left foot: Secondary | ICD-10-CM

## 2016-01-11 DIAGNOSIS — G8929 Other chronic pain: Secondary | ICD-10-CM | POA: Diagnosis not present

## 2016-01-11 DIAGNOSIS — M79671 Pain in right foot: Secondary | ICD-10-CM | POA: Diagnosis not present

## 2016-01-11 DIAGNOSIS — R5383 Other fatigue: Secondary | ICD-10-CM

## 2016-01-12 LAB — HEPATITIS PANEL, ACUTE
HCV Ab: NEGATIVE
HEP A IGM: NONREACTIVE
Hep B C IgM: NONREACTIVE
Hepatitis B Surface Ag: NEGATIVE

## 2016-01-12 LAB — CK: CK TOTAL: 84 U/L (ref 7–177)

## 2016-01-12 LAB — VITAMIN D 25 HYDROXY (VIT D DEFICIENCY, FRACTURES): Vit D, 25-Hydroxy: 21 ng/mL — ABNORMAL LOW (ref 30–100)

## 2016-01-14 LAB — ANA: ANA: NEGATIVE

## 2016-01-14 LAB — CYCLIC CITRUL PEPTIDE ANTIBODY, IGG

## 2016-01-14 LAB — RHEUMATOID FACTOR: Rhuematoid fact SerPl-aCnc: 25 IU/mL — ABNORMAL HIGH (ref <14)

## 2016-01-15 ENCOUNTER — Telehealth: Payer: Self-pay | Admitting: Rheumatology

## 2016-01-15 LAB — PROTEIN ELECTROPHORESIS, SERUM
ALBUMIN ELP: 4.2 g/dL (ref 3.8–4.8)
Alpha-1-Globulin: 0.3 g/dL (ref 0.2–0.3)
Alpha-2-Globulin: 0.8 g/dL (ref 0.5–0.9)
Beta 2: 0.4 g/dL (ref 0.2–0.5)
Beta Globulin: 0.4 g/dL (ref 0.4–0.6)
Gamma Globulin: 0.9 g/dL (ref 0.8–1.7)
TOTAL PROTEIN, SERUM ELECTROPHOR: 7 g/dL (ref 6.1–8.1)

## 2016-01-15 NOTE — Telephone Encounter (Signed)
New patient labs are reviewed in the office at follow up (they have not resulted yet) if anything is urgent, I will call her back to advise. I have called, her mail box is full and can not accept messages at this time.

## 2016-01-15 NOTE — Telephone Encounter (Signed)
Patient would like to know about lab results from lab week. Please advise.

## 2016-01-16 NOTE — Progress Notes (Signed)
Vitamin D 50,000 units once a week for 3 months will check vitamin D in 3 months. We will discuss rest of the labs at follow-up visit

## 2016-01-17 ENCOUNTER — Telehealth: Payer: Self-pay | Admitting: Radiology

## 2016-01-17 DIAGNOSIS — R7989 Other specified abnormal findings of blood chemistry: Secondary | ICD-10-CM

## 2016-01-17 MED ORDER — VITAMIN D3 1.25 MG (50000 UT) PO CAPS: 50000.0000 [IU] | ORAL_CAPSULE | ORAL | 0 refills | Status: AC

## 2016-01-17 NOTE — Telephone Encounter (Signed)
Called patient to advise sent in Vit D and ordered Vit D in

## 2016-01-17 NOTE — Telephone Encounter (Signed)
-----   Message from Bo Merino, MD sent at 01/16/2016  1:13 PM EST ----- Vitamin D 50,000 units once a week for 3 months will check vitamin D in 3 months. We will discuss rest of the labs at follow-up visit

## 2016-02-06 ENCOUNTER — Ambulatory Visit: Payer: BLUE CROSS/BLUE SHIELD | Admitting: Rheumatology

## 2016-02-13 DIAGNOSIS — R5383 Other fatigue: Secondary | ICD-10-CM | POA: Insufficient documentation

## 2016-02-13 DIAGNOSIS — M791 Myalgia, unspecified site: Secondary | ICD-10-CM | POA: Insufficient documentation

## 2016-02-13 NOTE — Progress Notes (Deleted)
Office Visit Note  Patient: [name]             Date of Birth: April 08, 1985           MRN: [id number]             PCP: Purvis Kilts, MD Referring: Sharilyn Sites, MD Visit Date: 02/15/2016 Occupation: @GUAROCC @    Subjective:  No chief complaint on file.   History of Present Illness: [name] is a 30 y.o. female ***   Activities of Daily Living:  Patient reports morning stiffness for *** {minute/hour:19697}.   Patient {ACTIONS;DENIES/REPORTS:21021675::"Denies"} nocturnal pain.  Difficulty dressing/grooming: {ACTIONS;DENIES/REPORTS:21021675::"Denies"} Difficulty climbing stairs: {ACTIONS;DENIES/REPORTS:21021675::"Denies"} Difficulty getting out of chair: {ACTIONS;DENIES/REPORTS:21021675::"Denies"} Difficulty using hands for taps, buttons, cutlery, and/or writing: {ACTIONS;DENIES/REPORTS:21021675::"Denies"}   No Rheumatology ROS completed.   PMFS History:  Patient Active Problem List   Diagnosis Date Noted  . Other fatigue 02/13/2016  . Myalgia 02/13/2016  . ADHD 01/10/2016  . Asthma 01/10/2016  . 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 ADHD (attention deficit hyperactivity disorder)   . Migraine     Family History  Problem Relation Age of Onset  . Diabetes Mother   . Hypertension Mother   . Asthma Mother   . Hypertension Father   . Hyperlipidemia Father   . Alcoholism Father   . Drug abuse Father   . Diabetes Maternal Grandmother   . Cancer Paternal Grandmother    Past Surgical History:  Procedure Laterality Date  . CESAREAN SECTION    . CHOLECYSTECTOMY    . TUBAL LIGATION     Social History   Social History Narrative  . No narrative on file     Objective: Vital Signs: There were no vitals taken for this visit.   Physical Exam   Musculoskeletal Exam: ***  CDAI Exam: No CDAI exam completed.    Investigation: Findings:  Xray bilateral hands and feet obtained at new patient  visit 01/11/16 are WNL , x-ray of bilateral knee joints showed moderate osteoarthritis. Labs 01/12/2016 rheumatoid factor 25(less than 14 is normal), CCP negative, ANA negative, CK 84, hepatitis panel negative, SPEP negative, vitamin D low at 21   No visits with results within 1 Month(s) from this visit.  Latest known visit with results is:  Office Visit on 01/11/2016  Component Date Value Ref Range Status  . Total CK 01/12/2016 84  7 - 177 U/L Final  . Anit Nuclear Antibody(ANA) 01/14/2016 NEG  NEGATIVE Final  . Rhuematoid fact SerPl-aCnc 01/14/2016 25* <14 IU/mL Final  . Cyclic Citrullin Peptide Ab 01/14/2016 <16  Units Final   Comment:   Reference Range Negative               < 20 Weak Positive            20 - 39 Moderate Positive        40 - 59 Strong Positive        > 59   . Hepatitis B Surface Ag 01/12/2016 NEGATIVE  NEGATIVE Final  . HCV Ab 01/12/2016 NEGATIVE  NEGATIVE Final  . Hep B C IgM 01/12/2016 NON REACTIVE  NON REACTIVE Final   Comment: High levels of Hepatitis B Core IgM antibody are detectable during the acute stage of Hepatitis B. This antibody is used to differentiate current from past HBV infection.     . Hep A IgM 01/12/2016 NON REACTIVE  NON REACTIVE Final   Comment:  Effective January 16, 2014, Hepatitis Acute Panel (test code [phone]) will be revised to automatically reflex to the Hepatitis C Viral RNA, Quantitative, Real-Time PCR assay if the Hepatitis C antibody screening result is Reactive. This action is being taken to ensure that the CDC/USPSTF recommended HCV diagnostic algorithm with the appropriate test reflex needed for accurate interpretation is followed.     . Total Protein, Serum Electrophores* 01/15/2016 7.0  6.1 - 8.1 g/dL Final  . Albumin ELP 01/15/2016 4.2  3.8 - 4.8 g/dL Final  . Alpha-1-Globulin 01/15/2016 0.3  0.2 - 0.3 g/dL Final  . Alpha-2-Globulin 01/15/2016 0.8  0.5 - 0.9 g/dL Final  . Beta Globulin 01/15/2016 0.4  0.4 - 0.6  g/dL Final  . Beta 2 01/15/2016 0.4  0.2 - 0.5 g/dL Final  . Gamma Globulin 01/15/2016 0.9  0.8 - 1.7 g/dL Final  . Abnormal Protein Band1 01/15/2016 NOT DET  g/dL Final  . SPE Interp. 01/15/2016 SEE NOTE   Final   Comment: Normal Electrophoretic Pattern Reviewed by Francis Gaines. Mammarappallil MD (Electronic Signature on File)   . Abnormal Protein Band2 01/15/2016 NOT DET  g/dL Final  . Abnormal Protein Band3 01/15/2016 NOT DET  g/dL Final  . Vit D, 25-Hydroxy 01/12/2016 21* 30 - 100 ng/mL Final   Comment: Vitamin D Status           25-OH Vitamin D        Deficiency                <20 ng/mL        Insufficiency         20 - 29 ng/mL        Optimal             > or = 30 ng/mL   For 25-OH Vitamin D testing on patients on D2-supplementation and patients for whom quantitation of D2 and D3 fractions is required, the QuestAssureD 25-OH VIT D, (D2,D3), LC/MS/MS is recommended: order code [id number] (patients > 2 yrs).     Imaging: No results found.  Speciality Comments: No specialty comments available.    Procedures:  No procedures performed Allergies: Prednisone   Assessment / Plan:     Visit Diagnoses: Pain in both hands - Rheumatid factor 25, CCP negative, ANA negative  Primary osteoarthritis of both knees  Vitamin D deficiency  Fibromyalgia  Attention deficit hyperactivity disorder  Other fatigue  Asthma       Orders: No orders of the defined types were placed in this encounter.  No orders of the defined types were placed in this encounter.   Face-to-face time spent with patient was *** minutes. 50% of time was spent in counseling and coordination of care.  Follow-Up Instructions: No Follow-up on file.   Bo Merino, MD

## 2016-02-15 ENCOUNTER — Ambulatory Visit: Payer: BLUE CROSS/BLUE SHIELD | Admitting: Rheumatology

## 2016-03-05 DIAGNOSIS — M79641 Pain in right hand: Secondary | ICD-10-CM | POA: Insufficient documentation

## 2016-03-05 DIAGNOSIS — Z8659 Personal history of other mental and behavioral disorders: Secondary | ICD-10-CM | POA: Insufficient documentation

## 2016-03-05 DIAGNOSIS — Z8709 Personal history of other diseases of the respiratory system: Secondary | ICD-10-CM | POA: Insufficient documentation

## 2016-03-05 DIAGNOSIS — E559 Vitamin D deficiency, unspecified: Secondary | ICD-10-CM | POA: Insufficient documentation

## 2016-03-05 DIAGNOSIS — M79642 Pain in left hand: Secondary | ICD-10-CM

## 2016-03-05 DIAGNOSIS — M170 Bilateral primary osteoarthritis of knee: Secondary | ICD-10-CM | POA: Insufficient documentation

## 2016-03-05 NOTE — Progress Notes (Deleted)
Office Visit Note  Patient: [name]             Date of Birth: [birth year]           MRN: [id number]             PCP: Purvis Kilts, MD Referring: Sharilyn Sites, MD Visit Date: 03/06/2016 Occupation: @GUAROCC @    Subjective:  No chief complaint on file.   History of Present Illness: [name] is a 31 y.o. female ***   Activities of Daily Living:  Patient reports morning stiffness for *** {minute/hour:19697}.   Patient {ACTIONS;DENIES/REPORTS:21021675::"Denies"} nocturnal pain.  Difficulty dressing/grooming: {ACTIONS;DENIES/REPORTS:21021675::"Denies"} Difficulty climbing stairs: {ACTIONS;DENIES/REPORTS:21021675::"Denies"} Difficulty getting out of chair: {ACTIONS;DENIES/REPORTS:21021675::"Denies"} Difficulty using hands for taps, buttons, cutlery, and/or writing: {ACTIONS;DENIES/REPORTS:21021675::"Denies"}   No Rheumatology ROS completed.   PMFS History:  Patient Active Problem List   Diagnosis Date Noted  . History of asthma 03/05/2016  . Pain in both hands 03/05/2016  . History of ADHD 03/05/2016  . Vitamin D deficiency 03/05/2016  . Primary osteoarthritis of both knees 03/05/2016  . Other fatigue 02/13/2016  . Myalgia 02/13/2016  . ADHD 01/10/2016  . Asthma 01/10/2016  . 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 ADHD (attention deficit hyperactivity disorder)   . Migraine     Family History  Problem Relation Age of Onset  . Diabetes Mother   . Hypertension Mother   . Asthma Mother   . Hypertension Father   . Hyperlipidemia Father   . Alcoholism Father   . Drug abuse Father   . Diabetes Maternal Grandmother   . Cancer Paternal Grandmother    Past Surgical History:  Procedure Laterality Date  . CESAREAN SECTION    . CHOLECYSTECTOMY    . TUBAL LIGATION     Social History   Social History Narrative  . No narrative on file     Objective: Vital Signs: There were no vitals taken for  this visit.   Physical Exam   Musculoskeletal Exam: ***  CDAI Exam: No CDAI exam completed.    Investigation: Findings:  01/11/2016 CK 84, SPEP normal, hepatitis panel normal, ANA negative, rheumatoid factor 25(less than 14 normal), CCP negative, vitamin D 21  Office Visit on 01/11/2016  Component Date Value Ref Range Status  . Total CK 01/12/2016 84  7 - 177 U/L Final  . Anit Nuclear Antibody(ANA) 01/14/2016 NEG  NEGATIVE Final  . Rhuematoid fact SerPl-aCnc 01/14/2016 25* <14 IU/mL Final  . Cyclic Citrullin Peptide Ab 01/14/2016 <16  Units Final   Comment:   Reference Range Negative               < 20 Weak Positive            20 - 39 Moderate Positive        40 - 59 Strong Positive        > 59   . Hepatitis B Surface Ag 01/12/2016 NEGATIVE  NEGATIVE Final  . HCV Ab 01/12/2016 NEGATIVE  NEGATIVE Final  . Hep B C IgM 01/12/2016 NON REACTIVE  NON REACTIVE Final   Comment: High levels of Hepatitis B Core IgM antibody are detectable during the acute stage of Hepatitis B. This antibody is used to differentiate current from past HBV infection.     . Hep A IgM 01/12/2016 NON REACTIVE  NON REACTIVE Final   Comment:   Effective January 16, 2014, Hepatitis Acute Panel (test code [id number])  will be revised to automatically reflex to the Hepatitis C Viral RNA, Quantitative, Real-Time PCR assay if the Hepatitis C antibody screening result is Reactive. This action is being taken to ensure that the CDC/USPSTF recommended HCV diagnostic algorithm with the appropriate test reflex needed for accurate interpretation is followed.     . Total Protein, Serum Electrophores* 01/15/2016 7.0  6.1 - 8.1 g/dL Final  . Albumin ELP 01/15/2016 4.2  3.8 - 4.8 g/dL Final  . Alpha-1-Globulin 01/15/2016 0.3  0.2 - 0.3 g/dL Final  . Alpha-2-Globulin 01/15/2016 0.8  0.5 - 0.9 g/dL Final  . Beta Globulin 01/15/2016 0.4  0.4 - 0.6 g/dL Final  . Beta 2 01/15/2016 0.4  0.2 - 0.5 g/dL Final  . Gamma Globulin  01/15/2016 0.9  0.8 - 1.7 g/dL Final  . Abnormal Protein Band1 01/15/2016 NOT DET  g/dL Final  . SPE Interp. 01/15/2016 SEE NOTE   Final   Comment: Normal Electrophoretic Pattern Reviewed by Francis Gaines. Mammarappallil MD (Electronic Signature on File)   . Abnormal Protein Band2 01/15/2016 NOT DET  g/dL Final  . Abnormal Protein Band3 01/15/2016 NOT DET  g/dL Final  . Vit D, 25-Hydroxy 01/12/2016 21* 30 - 100 ng/mL Final   Comment: Vitamin D Status           25-OH Vitamin D        Deficiency                <20 ng/mL        Insufficiency         20 - 29 ng/mL        Optimal             > or = 30 ng/mL   For 25-OH Vitamin D testing on patients on D2-supplementation and patients for whom quantitation of D2 and D3 fractions is required, the QuestAssureD 25-OH VIT D, (D2,D3), LC/MS/MS is recommended: order code [id number] (patients > 2 yrs).      Imaging: No results found.  Speciality Comments: No specialty comments available.    Procedures:  No procedures performed Allergies: Prednisone   Assessment / Plan:     Visit Diagnoses: Other fatigue  Primary osteoarthritis of both knees - Bilateral moderate  Pain in both hands - Positive rheumatoid factor XXV, CCP negative, ANA negative  Vitamin D deficiency  Fibromyalgia  History of asthma  History of ADHD    Orders: No orders of the defined types were placed in this encounter.  No orders of the defined types were placed in this encounter.   Face-to-face time spent with patient was *** minutes. 50% of time was spent in counseling and coordination of care.  Follow-Up Instructions: No Follow-up on file.   Bo Merino, MD

## 2016-03-06 ENCOUNTER — Ambulatory Visit: Payer: BLUE CROSS/BLUE SHIELD | Admitting: Rheumatology

## 2016-09-05 ENCOUNTER — Ambulatory Visit: Payer: BLUE CROSS/BLUE SHIELD | Admitting: "Endocrinology

## 2017-03-25 ENCOUNTER — Emergency Department (HOSPITAL_COMMUNITY)
Admission: EM | Admit: 2017-03-25 | Discharge: 2017-03-25 | Disposition: A | Discharge status: Home / Self Care | Payer: Self-pay | Attending: Emergency Medicine | Admitting: Emergency Medicine

## 2017-03-25 ENCOUNTER — Emergency Department (HOSPITAL_COMMUNITY): Payer: Self-pay

## 2017-03-25 ENCOUNTER — Encounter (HOSPITAL_COMMUNITY): Payer: Self-pay | Admitting: Emergency Medicine

## 2017-03-25 DIAGNOSIS — J45909 Unspecified asthma, uncomplicated: Secondary | ICD-10-CM | POA: Insufficient documentation

## 2017-03-25 DIAGNOSIS — G43009 Migraine without aura, not intractable, without status migrainosus: Secondary | ICD-10-CM | POA: Insufficient documentation

## 2017-03-25 DIAGNOSIS — F17210 Nicotine dependence, cigarettes, uncomplicated: Secondary | ICD-10-CM | POA: Insufficient documentation

## 2017-03-25 DIAGNOSIS — M542 Cervicalgia: Secondary | ICD-10-CM | POA: Insufficient documentation

## 2017-03-25 DIAGNOSIS — Z79899 Other long term (current) drug therapy: Secondary | ICD-10-CM | POA: Insufficient documentation

## 2017-03-25 MED ORDER — NAPROXEN 250 MG PO TABS: ORAL_TABLET | ORAL | 0 refills | Status: DC

## 2017-03-25 MED ORDER — DIPHENHYDRAMINE HCL 50 MG/ML IJ SOLN
25.0000 mg | Freq: Once | INTRAMUSCULAR | Status: AC
  Administered 2017-03-25: 25 mg via INTRAVENOUS
  Filled 2017-03-25: qty 1

## 2017-03-25 MED ORDER — SODIUM CHLORIDE 0.9 % IV BOLUS (SEPSIS)
1000.0000 mL | Freq: Once | INTRAVENOUS | Status: AC
Start: 2017-03-25 — End: 2017-03-25
  Administered 2017-03-25: 1000 mL via INTRAVENOUS

## 2017-03-25 MED ORDER — METHOCARBAMOL 500 MG PO TABS: ORAL_TABLET | ORAL | 0 refills | Status: DC

## 2017-03-25 MED ORDER — METOCLOPRAMIDE HCL 5 MG/ML IJ SOLN
10.0000 mg | Freq: Once | INTRAMUSCULAR | Status: AC
  Administered 2017-03-25: 10 mg via INTRAVENOUS
  Filled 2017-03-25: qty 2

## 2017-03-25 MED ORDER — CYCLOBENZAPRINE HCL 10 MG PO TABS
5.0000 mg | ORAL_TABLET | Freq: Once | ORAL | Status: AC
  Administered 2017-03-25: 5 mg via ORAL
  Filled 2017-03-25: qty 1

## 2017-03-25 MED ORDER — SODIUM CHLORIDE 0.9 % IV BOLUS (SEPSIS)
1000.0000 mL | Freq: Once | INTRAVENOUS | Status: AC
  Administered 2017-03-25: 1000 mL via INTRAVENOUS

## 2017-03-25 NOTE — ED Provider Notes (Signed)
[facility] EMERGENCY DEPARTMENT Provider Note   CSN: [id number] Arrival date & time: 03/25/17  0536  Time seen 05:57 AM   History   Chief Complaint Chief Complaint  Patient presents with  . Migraine    HPI [name] is a 32 y.o. female.  HPI patient reports she used to get headaches pretty frequently however she has not had one now for at least a year.  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  She states it is holo-cranial but the right side hurts more than the left.  The pain goes back into her neck.  She states that it is a pressure sensation in her neck and in the back of her head and then throbbing in the front.  She denies change in vision, she has had nausea without vomiting.  She states her hands are tingling in her going numb.  She states she has had that before with carpal tunnel however tonight is different and that it goes all the way up her arm.  She states her cheeks feel tingling inside and outside.  This is in both cheeks.  She states bright lights, noise, and laying flat makes her headache worse, sitting up makes it feel little bit better.  She denies rhinorrhea, sore throat, sneezing, or coughing.  She states she has chronic pain in her neck.  He denies any family history of migraine headaches.  PCP Sharilyn Sites, MD   Past Medical History:  Diagnosis Date  . ADHD (attention deficit hyperactivity disorder)   . Migraine     Patient Active Problem List   Diagnosis Date Noted  . History of asthma 03/05/2016  . Pain in both hands 03/05/2016  . History of ADHD 03/05/2016  . Vitamin D deficiency 03/05/2016  . Primary osteoarthritis of both knees 03/05/2016  . Other fatigue 02/13/2016  . Myalgia 02/13/2016  . ADHD 01/10/2016  . Asthma 01/10/2016  . 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 CESAREAN SECTION    . CHOLECYSTECTOMY    . TUBAL LIGATION      OB History    No data available       Home Medications    Prior to Admission medications   Medication Sig Start Date End Date Taking? Authorizing Provider  amphetamine-dextroamphetamine (ADDERALL XR) 30 MG 24 hr capsule Take 30 mg by mouth daily.    [provider]  amphetamine-dextroamphetamine (ADDERALL) 20 MG tablet Take 20 mg by mouth daily.    [provider]  furosemide (LASIX) 20 MG tablet Take 20 mg by mouth.    [provider]  methocarbamol (ROBAXIN) 500 MG tablet Take 1 or 2 po Q 6hrs for muscle pain 03/25/17   Rolland Porter, MD  naproxen (NAPROSYN) 250 MG tablet Take 1 po BID with food prn pain 03/25/17   Rolland Porter, MD  [facility] 80-4.5 MCG/ACT inhaler Inhale 2 puffs into the lungs 2 (two) times daily.  01/08/16   [provider]    Family History Family History  Problem Relation Age of Onset  . Diabetes Mother   . Hypertension Mother   . Asthma Mother   . Hypertension Father   . Hyperlipidemia Father   . Alcoholism Father   . Drug abuse Father   . Diabetes Maternal Grandmother   .  Cancer Paternal Grandmother     Social History Social History   Tobacco Use  . Smoking status: Current Every Day Smoker    Packs/day: 0.50    Types: Cigarettes  . Smokeless tobacco: Never Used  Substance Use Topics  . Alcohol use: No  . Drug use: No  employed Works a a Educational psychologist   Allergies   Prednisone   Review of Systems Review of Systems  All other systems reviewed and are negative.    Physical Exam Updated Vital Signs BP 135/90 (BP Location: Right Arm)   Pulse 66   Temp 98.6 F (37 C) (Oral)   Resp 18   Ht 5\' 3"  (1.6 m)   Wt 81.6 kg (180 lb)   LMP 03/25/2017   SpO2 98%   BMI 31.89 kg/m   Vital signs normal    Physical Exam  Constitutional: She is oriented to person, place, and time. She appears well-developed and  well-nourished.  Non-toxic appearance. She does not appear ill. She appears distressed.  Appears to have photophobia  HENT:  Head: Normocephalic and atraumatic.  Right Ear: External ear normal.  Left Ear: External ear normal.  Nose: Nose normal. No mucosal edema or rhinorrhea.  Mouth/Throat: Oropharynx is clear and moist and mucous membranes are normal. No dental abscesses or uvula swelling.  Eyes: Conjunctivae and EOM are normal. Pupils are equal, round, and reactive to light.  Neck: Normal range of motion and full passive range of motion without pain. Neck supple.    Patient is tender in the midline cervical spine and on the left paraspinous muscles but she is most tender on the right paraspinous muscles in the right trapezius.  Cardiovascular: Normal rate, regular rhythm and normal heart sounds. Exam reveals no gallop and no friction rub.  No murmur heard. Pulmonary/Chest: Effort normal and breath sounds normal. No respiratory distress. She has no wheezes. She has no rhonchi. She has no rales. She exhibits no tenderness and no crepitus.  Abdominal: Soft. Normal appearance and bowel sounds are normal. She exhibits no distension. There is no tenderness. There is no rebound and no guarding.  Musculoskeletal: Normal range of motion. She exhibits no edema or tenderness.  Moves all extremities well.   Neurological: She is alert and oriented to person, place, and time. She has normal strength. No cranial nerve deficit.  Skin: Skin is warm, dry and intact. No rash noted. No erythema. No pallor.  Psychiatric: She has a normal mood and affect. Her speech is normal and behavior is normal. Her mood appears not anxious.  Nursing note and vitals reviewed.    ED Treatments / Results  Labs (all labs ordered are listed, but only abnormal results are displayed) Labs Reviewed - No data to display  EKG  EKG Interpretation None       Radiology Dg Cervical Spine Complete  Result Date:  03/25/2017 CLINICAL DATA:  Chronic neck pain. Now swelling and tingling in both arms and legs. Migraine since last night. EXAM: CERVICAL SPINE - COMPLETE 4+ VIEW COMPARISON:  None. FINDINGS: There is no evidence of cervical spine fracture or prevertebral soft tissue swelling. Alignment is normal. No other significant bone abnormalities are identified. IMPRESSION: Negative cervical spine radiographs. Electronically Signed   By: Lucienne Capers M.D.   On: 03/25/2017 06:51    Procedures Procedures (including critical care time)  Medications Ordered in ED Medications  sodium chloride 0.9 % bolus 1,000 mL (1,000 mLs Intravenous New Bag/Given 03/25/17 0625)  sodium chloride 0.9 %  bolus 1,000 mL (1,000 mLs Intravenous New Bag/Given 03/25/17 0625)  metoCLOPramide (REGLAN) injection 10 mg (10 mg Intravenous Given 03/25/17 0626)  diphenhydrAMINE (BENADRYL) injection 25 mg (25 mg Intravenous Given 03/25/17 0626)  cyclobenzaprine (FLEXERIL) tablet 5 mg (5 mg Oral Given 03/25/17 2130)     Initial Impression / Assessment and Plan / ED Course  I have reviewed the triage vital signs and the nursing notes.  Pertinent labs & imaging results that were available during my care of the patient were reviewed by me and considered in my medical decision making (see chart for details).     X-rays were obtained of patient's cervical spine to evaluate her c/o chronic neck pain and bilateral finger tingling.  She was given migraine cocktail.  Recheck at 7:15 AM she states her headache is much improved, she states she does not need anything else for her headache.  She is still getting her IV fluids.  We discussed her cervical spine x-rays which were normal.  Her pain is probably related to musculoskeletal tension with her work and children and stress in life.  Final Clinical Impressions(s) / ED Diagnoses   Final diagnoses:  Migraine without aura and without status migrainosus, not intractable  Neck pain    ED  Discharge Orders        Ordered    naproxen (NAPROSYN) 250 MG tablet     03/25/17 0730    methocarbamol (ROBAXIN) 500 MG tablet     03/25/17 0730      Plan discharge  Rolland Porter, MD, Barbette Or, MD 03/25/17 [phone]

## 2017-03-25 NOTE — ED Notes (Signed)
ED Provider at bedside. 

## 2017-03-25 NOTE — Discharge Instructions (Signed)
Use ice and heat on your painful neck muscles for comfort. Take the naproxen and robaxin for the soreness in your neck muscles.  Recheck if you get worse.

## 2017-03-25 NOTE — ED Triage Notes (Signed)
Pt with migraine since 9pm last night and has had nausea, diarrhea, metal taste in her mouth, as well as swelling in her hands and arms with tingling sensation.

## 2018-05-14 DIAGNOSIS — Z1389 Encounter for screening for other disorder: Secondary | ICD-10-CM | POA: Diagnosis not present

## 2018-05-14 DIAGNOSIS — J069 Acute upper respiratory infection, unspecified: Secondary | ICD-10-CM | POA: Diagnosis not present

## 2018-05-14 DIAGNOSIS — E6609 Other obesity due to excess calories: Secondary | ICD-10-CM | POA: Diagnosis not present

## 2018-05-14 DIAGNOSIS — Z6834 Body mass index (BMI) 34.0-34.9, adult: Secondary | ICD-10-CM | POA: Diagnosis not present

## 2018-05-20 DIAGNOSIS — D2239 Melanocytic nevi of other parts of face: Secondary | ICD-10-CM | POA: Diagnosis not present

## 2018-05-20 DIAGNOSIS — L821 Other seborrheic keratosis: Secondary | ICD-10-CM | POA: Diagnosis not present

## 2018-07-16 DIAGNOSIS — F909 Attention-deficit hyperactivity disorder, unspecified type: Secondary | ICD-10-CM | POA: Diagnosis not present

## 2018-07-16 DIAGNOSIS — J3089 Other allergic rhinitis: Secondary | ICD-10-CM | POA: Diagnosis not present

## 2018-08-19 DIAGNOSIS — Z1389 Encounter for screening for other disorder: Secondary | ICD-10-CM | POA: Diagnosis not present

## 2018-08-19 DIAGNOSIS — F909 Attention-deficit hyperactivity disorder, unspecified type: Secondary | ICD-10-CM | POA: Diagnosis not present

## 2018-08-19 DIAGNOSIS — Z6837 Body mass index (BMI) 37.0-37.9, adult: Secondary | ICD-10-CM | POA: Diagnosis not present

## 2019-01-07 DIAGNOSIS — J019 Acute sinusitis, unspecified: Secondary | ICD-10-CM | POA: Diagnosis not present

## 2019-01-07 DIAGNOSIS — Z6837 Body mass index (BMI) 37.0-37.9, adult: Secondary | ICD-10-CM | POA: Diagnosis not present

## 2019-01-07 DIAGNOSIS — F909 Attention-deficit hyperactivity disorder, unspecified type: Secondary | ICD-10-CM | POA: Diagnosis not present

## 2019-03-17 ENCOUNTER — Telehealth: Payer: Self-pay | Admitting: Adult Health

## 2019-03-17 NOTE — Telephone Encounter (Signed)
Called patient regarding appointment scheduled in our office and advised to come alone to the visit, however, a support person, over age 35, may accompany her to appointment if assistance is needed for safety or care concerns. Otherwise, support persons should remain outside until the visit is complete.   Prescreen questions asked: 1. Any of the following symptoms of COVID such as chills, fever, cough, shortness of breath, muscle pain, diarrhea, rash, vomiting, abdominal pain, red eye, weakness, bruising, bleeding, joint pain, loss of taste or smell, a severe headache, sore throat, fatigue 2. Any exposure to anyone suspected or confirmed of having COVID-19 3. Awaiting test results for COVID-19  Also,to keep you safe, please use the provided hand sanitizer when you enter the office. We are asking everyone in the office to wear a mask to help prevent the spread of germs. If you have a mask of your own, please wear it to your appointment, if not, we are happy to provide one for you.  Thank you for understanding and your cooperation.    [facility] Staff      

## 2019-03-18 ENCOUNTER — Ambulatory Visit (INDEPENDENT_AMBULATORY_CARE_PROVIDER_SITE_OTHER): Payer: BC Managed Care – PPO | Admitting: Adult Health

## 2019-03-18 ENCOUNTER — Other Ambulatory Visit (HOSPITAL_COMMUNITY)
Admission: RE | Admit: 2019-03-18 | Discharge: 2019-03-18 | Disposition: A | Discharge status: Home / Self Care | Payer: BC Managed Care – PPO | Source: Ambulatory Visit | Attending: Adult Health | Admitting: Adult Health

## 2019-03-18 ENCOUNTER — Encounter: Payer: Self-pay | Admitting: Adult Health

## 2019-03-18 ENCOUNTER — Other Ambulatory Visit: Payer: Self-pay

## 2019-03-18 VITALS — BP 141/87 | HR 82 | Ht 61.0 in | Wt 200.8 lb

## 2019-03-18 DIAGNOSIS — R1032 Left lower quadrant pain: Secondary | ICD-10-CM | POA: Insufficient documentation

## 2019-03-18 DIAGNOSIS — Z01419 Encounter for gynecological examination (general) (routine) without abnormal findings: Secondary | ICD-10-CM | POA: Insufficient documentation

## 2019-03-18 DIAGNOSIS — Z800 Family history of malignant neoplasm of digestive organs: Secondary | ICD-10-CM | POA: Diagnosis not present

## 2019-03-18 NOTE — Progress Notes (Signed)
Patient ID: [name], female   DOB: 1985-10-06, 34 y.o.   MRN: [id number] History of Present Illness:  [name] is a 34 year old white female, married, G3P3, in for a well woman gyn exam and pap.She had a tubal and says it has been about 10 years since last pap.She has 3 boys and is working at [employer].  Her Mom died at 47 from stage 4 kidney cancer and color and ovarian, cancer. She found out at 11, and it was thought that it started in colon.  PCP  is Dr Hilma Favors.   Current Medications, Allergies, Past Medical History, Past Surgical History, Family History and Social History were reviewed in [employer] record.     Review of Systems: Patient denies any headaches, hearing loss, fatigue, blurred vision, shortness of breath, chest pain, abdominal pain, problems with bowel movements, urination, or intercourse. No joint pain or mood swings. Has occasional cramping after sex     Physical Exam:BP (!) 141/87 (BP Location: Left Arm, Patient Position: Sitting, Cuff Size: Normal)   Pulse 82   Ht 5\' 1"  (1.549 m)   Wt 200 lb 12.8 oz (91.1 kg)   LMP 02/14/2019 (Exact Date)   BMI 37.94 kg/m  General:  Well developed, well nourished, no acute distress Skin:  Warm and dry Neck:  Midline trachea, normal thyroid, good ROM, no lymphadenopathy Lungs; Clear to auscultation bilaterally Breast:  No dominant palpable mass, retraction, or nipple discharge Cardiovascular: Regular rate and rhythm Abdomen:  Soft, non tender, no hepatosplenomegaly Pelvic:  External genitalia is normal in appearance, no lesions.  The vagina is normal in appearance. Urethra has no lesions or masses. The cervix is bulbous, nabothian cyst at 12 0'clock, pap with high risk HPV 16/18 genotyping performed.  Uterus is felt to be normal size, shape, and contour.  No adnexal masses, LLQ tenderness noted.Bladder is non tender, no masses felt. Extremities/musculoskeletal:  No swelling or varicosities noted, no  clubbing or cyanosis Psych:  No mood changes, alert and cooperative,seems happy Fall risk is low PHQ 2 score is 0. Examination chaperoned by Estill Bamberg Rash LPN  Impression and Plan; 1. Encounter for gynecological examination with Papanicolaou smear of cervix Pap sent Physical in 1 year Pap in 3 if normal Mammogram at 39 Decrease smoking   2. LLQ pain Return 03/30/19 for GYN [place] to assess uterus  And ovaries   3. Family history of colon cancer in mother Colonoscopy recommended at 84-45, since do not know when mom's started

## 2019-03-22 DIAGNOSIS — Z01419 Encounter for gynecological examination (general) (routine) without abnormal findings: Secondary | ICD-10-CM | POA: Diagnosis not present

## 2019-03-23 ENCOUNTER — Telehealth: Payer: Self-pay | Admitting: *Deleted

## 2019-03-23 LAB — COMPREHENSIVE METABOLIC PANEL
ALT: 17 IU/L (ref 0–32)
AST: 14 IU/L (ref 0–40)
Albumin/Globulin Ratio: 1.8 (ref 1.2–2.2)
Albumin: 4.4 g/dL (ref 3.8–4.8)
Alkaline Phosphatase: 42 IU/L (ref 39–117)
BUN/Creatinine Ratio: 14 (ref 9–23)
BUN: 12 mg/dL (ref 6–20)
Bilirubin Total: 0.6 mg/dL (ref 0.0–1.2)
CO2: 21 mmol/L (ref 20–29)
Calcium: 9.4 mg/dL (ref 8.7–10.2)
Chloride: 101 mmol/L (ref 96–106)
Creatinine, Ser: 0.86 mg/dL (ref 0.57–1.00)
GFR calc Af Amer: 102 mL/min/{1.73_m2} (ref >59)
GFR calc non Af Amer: 88 mL/min/{1.73_m2} (ref >59)
Globulin, Total: 2.4 g/dL (ref 1.5–4.5)
Glucose: 93 mg/dL (ref 65–99)
Potassium: 4.4 mmol/L (ref 3.5–5.2)
Sodium: 137 mmol/L (ref 134–144)
Total Protein: 6.8 g/dL (ref 6.0–8.5)

## 2019-03-23 LAB — LIPID PANEL
Chol/HDL Ratio: 3.2 ratio (ref 0.0–4.4)
Cholesterol, Total: 155 mg/dL (ref 100–199)
HDL: 49 mg/dL (ref >39)
LDL Chol Calc (NIH): 92 mg/dL (ref 0–99)
Triglycerides: 70 mg/dL (ref 0–149)
VLDL Cholesterol Cal: 14 mg/dL (ref 5–40)

## 2019-03-23 LAB — CBC
Hematocrit: 43.2 % (ref 34.0–46.6)
Hemoglobin: 15 g/dL (ref 11.1–15.9)
MCH: 30.2 pg (ref 26.6–33.0)
MCHC: 34.7 g/dL (ref 31.5–35.7)
MCV: 87 fL (ref 79–97)
Platelets: 318 10*3/uL (ref 150–450)
RBC: 4.96 x10E6/uL (ref 3.77–5.28)
RDW: 12 % (ref 11.7–15.4)
WBC: 9.4 10*3/uL (ref 3.4–10.8)

## 2019-03-23 LAB — CYTOLOGY - PAP
Comment: NEGATIVE
Diagnosis: NEGATIVE
High risk HPV: NEGATIVE

## 2019-03-23 LAB — TSH: TSH: 1.63 u[IU]/mL (ref 0.450–4.500)

## 2019-03-23 NOTE — Telephone Encounter (Signed)
Pt aware labs look good. Pt voiced understanding. [place]

## 2019-03-23 NOTE — Telephone Encounter (Signed)
-----   Message from Estill Dooms, NP sent at 03/23/2019  8:32 AM EST ----- Let Janett Billow know labs look good

## 2019-03-29 ENCOUNTER — Telehealth: Payer: Self-pay | Admitting: Obstetrics & Gynecology

## 2019-03-29 NOTE — Telephone Encounter (Signed)

## 2019-03-30 ENCOUNTER — Ambulatory Visit (INDEPENDENT_AMBULATORY_CARE_PROVIDER_SITE_OTHER): Payer: BC Managed Care – PPO

## 2019-03-30 ENCOUNTER — Other Ambulatory Visit: Payer: Self-pay

## 2019-03-30 DIAGNOSIS — R1032 Left lower quadrant pain: Secondary | ICD-10-CM

## 2019-03-30 NOTE — Progress Notes (Signed)
PELVIC US TA/TV:homogeneous anteverted uterus,wnl,EEC 8.7 mm normal ovaries,ovaries appear mobile,no free fluid,some bilat adnexal discomfort during ultrasound  Chaperone Peggy

## 2019-04-04 ENCOUNTER — Telehealth: Payer: Self-pay | Admitting: Adult Health

## 2019-04-04 NOTE — Telephone Encounter (Signed)
Pt aware that GYN [place] normal

## 2019-05-06 DIAGNOSIS — F909 Attention-deficit hyperactivity disorder, unspecified type: Secondary | ICD-10-CM | POA: Diagnosis not present

## 2019-05-17 ENCOUNTER — Ambulatory Visit: Payer: BC Managed Care – PPO | Attending: Internal Medicine

## 2019-05-17 ENCOUNTER — Other Ambulatory Visit: Payer: Self-pay

## 2019-05-17 DIAGNOSIS — Z20822 Contact with and (suspected) exposure to covid-19: Secondary | ICD-10-CM

## 2019-05-18 ENCOUNTER — Telehealth: Payer: Self-pay | Admitting: *Deleted

## 2019-05-18 LAB — NOVEL CORONAVIRUS, NAA: SARS-CoV-2, NAA: NOT DETECTED

## 2019-05-18 NOTE — Telephone Encounter (Signed)
Patient calling for COVID result- notified still pending.

## 2019-05-18 NOTE — Telephone Encounter (Signed)
Pt notified of negative covid 19 test results. She voiced understanding.

## 2019-08-04 DIAGNOSIS — Z0001 Encounter for general adult medical examination with abnormal findings: Secondary | ICD-10-CM | POA: Diagnosis not present

## 2019-08-04 DIAGNOSIS — Z1389 Encounter for screening for other disorder: Secondary | ICD-10-CM | POA: Diagnosis not present

## 2019-08-04 DIAGNOSIS — F909 Attention-deficit hyperactivity disorder, unspecified type: Secondary | ICD-10-CM | POA: Diagnosis not present

## 2019-08-04 DIAGNOSIS — E6609 Other obesity due to excess calories: Secondary | ICD-10-CM | POA: Diagnosis not present

## 2019-08-04 DIAGNOSIS — Z6836 Body mass index (BMI) 36.0-36.9, adult: Secondary | ICD-10-CM | POA: Diagnosis not present

## 2019-08-04 DIAGNOSIS — E2749 Other adrenocortical insufficiency: Secondary | ICD-10-CM | POA: Diagnosis not present

## 2019-11-16 ENCOUNTER — Other Ambulatory Visit: Payer: BC Managed Care – PPO

## 2019-11-16 ENCOUNTER — Other Ambulatory Visit: Payer: Self-pay

## 2019-11-16 DIAGNOSIS — Z20822 Contact with and (suspected) exposure to covid-19: Secondary | ICD-10-CM

## 2019-11-16 DIAGNOSIS — J22 Unspecified acute lower respiratory infection: Secondary | ICD-10-CM | POA: Diagnosis not present

## 2019-11-16 DIAGNOSIS — Z6836 Body mass index (BMI) 36.0-36.9, adult: Secondary | ICD-10-CM | POA: Diagnosis not present

## 2019-11-16 DIAGNOSIS — E6609 Other obesity due to excess calories: Secondary | ICD-10-CM | POA: Diagnosis not present

## 2019-11-17 ENCOUNTER — Ambulatory Visit: Payer: Self-pay | Admitting: *Deleted

## 2019-11-17 NOTE — Telephone Encounter (Signed)
Patent requesting covid test results. Results still pending at this time.

## 2019-11-18 ENCOUNTER — Telehealth: Payer: Self-pay | Admitting: *Deleted

## 2019-11-18 NOTE — Telephone Encounter (Signed)
Patient called for result of COVID test obtained 11/16/19; explained that result is not ready, and could take 3-5 days; explained result is available in MyChart. Also explained to patient she will be called regarding result; pt encouraged to answer all calls.

## 2019-11-20 LAB — NOVEL CORONAVIRUS, NAA

## 2019-11-24 ENCOUNTER — Ambulatory Visit
Admission: EM | Admit: 2019-11-24 | Discharge: 2019-11-24 | Disposition: A | Discharge status: Home / Self Care | Payer: BC Managed Care – PPO | Attending: Emergency Medicine | Admitting: Emergency Medicine

## 2019-11-24 ENCOUNTER — Other Ambulatory Visit: Payer: Self-pay

## 2019-11-24 DIAGNOSIS — R6889 Other general symptoms and signs: Secondary | ICD-10-CM | POA: Diagnosis not present

## 2019-11-24 DIAGNOSIS — Z1152 Encounter for screening for COVID-19: Secondary | ICD-10-CM

## 2019-11-24 DIAGNOSIS — Z20822 Contact with and (suspected) exposure to covid-19: Secondary | ICD-10-CM

## 2019-11-24 MED ORDER — ONDANSETRON HCL 4 MG PO TABS: 4.0000 mg | ORAL_TABLET | Freq: Four times a day (QID) | ORAL | 0 refills | Status: DC

## 2019-11-24 NOTE — ED Provider Notes (Signed)
[place]   [id number] 11/24/19 Arrival Time: 27   CC: COVID symptoms  SUBJECTIVE: History from: patient.  EMMELY Vasquez is a 34 y.o. female who presents with nasal congestion x 1 week, headache, 3-4 episodes of watery/ loose diarrhea daily, and vomiting x couple of days.  Denies sick exposure to COVID, flu or strep.  Had negative COVID test.  Has tried z-pak by PCP without relief.  Denies aggravating factors.   Denies fever, chills, rhinorrhea, sore throat, SOB, wheezing, chest pain, changes in bladder habits.    ROS: As per HPI.  All other pertinent ROS negative.     Past Medical History:  Diagnosis Date  . ADHD (attention deficit hyperactivity disorder)   . Migraine    Past Surgical History:  Procedure Laterality Date  . CESAREAN SECTION    . CHOLECYSTECTOMY    . TUBAL LIGATION     Allergies  Allergen Reactions  . Prednisone Nausea And Vomiting   No current facility-administered medications on file prior to encounter.   Current Outpatient Medications on File Prior to Encounter  Medication Sig Dispense Refill  . amphetamine-dextroamphetamine (ADDERALL XR) 30 MG 24 hr capsule Take 30 mg by mouth daily.    Marland Kitchen amphetamine-dextroamphetamine (ADDERALL) 20 MG tablet Take 20 mg by mouth daily.     Social History   Socioeconomic History  . Marital status: Married    Spouse name: Not on file  . Number of children: Not on file  . Years of education: Not on file  . Highest education level: Not on file  Occupational History  . Not on file  Tobacco Use  . Smoking status: Current Every Day Smoker    Packs/day: 0.50    Types: Cigarettes  . Smokeless tobacco: Never Used  Vaping Use  . Vaping Use: Never used  Substance and Sexual Activity  . Alcohol use: No  . Drug use: No  . Sexual activity: Yes    Birth control/protection: Surgical    Comment: tubal ligation  Other Topics Concern  . Not on file  Social History Narrative  . Not on file   Social  Determinants of Health   Financial Resource Strain:   . Difficulty of Paying Living Expenses: Not on file  Food Insecurity:   . Worried About Charity fundraiser in the Last Year: Not on file  . Ran Out of Food in the Last Year: Not on file  Transportation Needs:   . Lack of Transportation (Medical): Not on file  . Lack of Transportation (Non-Medical): Not on file  Physical Activity:   . Days of Exercise per Week: Not on file  . Minutes of Exercise per Session: Not on file  Stress:   . Feeling of Stress : Not on file  Social Connections:   . Frequency of Communication with Friends and Family: Not on file  . Frequency of Social Gatherings with Friends and Family: Not on file  . Attends Religious Services: Not on file  . Active Member of Clubs or Organizations: Not on file  . Attends Archivist Meetings: Not on file  . Marital Status: Not on file  Intimate Partner Violence:   . Fear of Current or Ex-Partner: Not on file  . Emotionally Abused: Not on file  . Physically Abused: Not on file  . Sexually Abused: Not on file   Family History  Problem Relation Age of Onset  . Diabetes Mother   . Hypertension Mother   .  Asthma Mother   . Colon cancer Mother   . Cancer Mother 52       stage 4 kidney  . Ovarian cancer Mother   . Hypertension Father   . Hyperlipidemia Father   . Alcoholism Father   . Drug abuse Father   . Diabetes Maternal Grandmother   . Cancer Paternal Grandmother     OBJECTIVE:  Vitals:   11/24/19 1601  BP: (!) 145/95  Pulse: 89  Resp: 20  Temp: 98.7 F (37.1 C)  SpO2: 95%     General appearance: alert; mildly fatigued appearing, nontoxic; speaking in full sentences and tolerating own secretions HEENT: NCAT; Ears: EACs clear, TMs pearly gray; Eyes: PERRL.  EOM grossly intact. Nose: nares patent without rhinorrhea, Throat: oropharynx clear, tonsils non erythematous or enlarged, uvula midline  Neck: supple without LAD Lungs: unlabored  respirations, symmetrical air entry; cough: absent; no respiratory distress; CTAB Heart: regular rate and rhythm.  Skin: warm and dry Psychological: alert and cooperative; normal mood and affect   ASSESSMENT & PLAN:  1. Encounter for screening for COVID-19   2. Flu-like symptoms   3. Suspected COVID-19 virus infection     Meds ordered this encounter  Medications  . ondansetron (ZOFRAN) 4 MG tablet    Sig: Take 1 tablet (4 mg total) by mouth every 6 (six) hours.    Dispense:  12 tablet    Refill:  0    Order Specific Question:   Supervising Provider    Answer:   Raylene Everts [[id number]]    COVID testing ordered.  It will take between 5-7 days for test results.  Someone will contact you regarding abnormal results.    In the meantime: You should remain isolated in your home for 10 days from symptom onset AND greater than 72 hours after symptoms resolution (absence of fever without the use of fever-reducing medication and improvement in respiratory symptoms), whichever is longer Get plenty of rest and push fluids zofran for nausea Use OTC zyrtec for nasal congestion, runny nose, and/or sore throat Use OTC flonase for nasal congestion and runny nose Use medications daily for symptom relief Use OTC medications like ibuprofen or tylenol as needed fever or pain Call or go to the ED if you have any new or worsening symptoms such as fever, cough, shortness of breath, chest tightness, chest pain, turning blue, changes in mental status, etc...   Reviewed expectations re: course of current medical issues. Questions answered. Outlined signs and symptoms indicating need for more acute intervention. Patient verbalized understanding. After Visit Summary given.         Lestine Box, PA-C 11/24/19 1621

## 2019-11-24 NOTE — ED Triage Notes (Signed)
Pt completed z pack a few days ago but is still having symptoms of nasal congestion, tested negative for covid then developed diarrhea and vomiting a couple days ago

## 2019-11-24 NOTE — Discharge Instructions (Signed)
COVID testing ordered.  It will take between 5-7 days for test results.  Someone will contact you regarding abnormal results.    In the meantime: You should remain isolated in your home for 10 days from symptom onset AND greater than 72 hours after symptoms resolution (absence of fever without the use of fever-reducing medication and improvement in respiratory symptoms), whichever is longer Get plenty of rest and push fluids zofran for nausea Use OTC zyrtec for nasal congestion, runny nose, and/or sore throat Use OTC flonase for nasal congestion and runny nose Use medications daily for symptom relief Use OTC medications like ibuprofen or tylenol as needed fever or pain Call or go to the ED if you have any new or worsening symptoms such as fever, cough, shortness of breath, chest tightness, chest pain, turning blue, changes in mental status, etc..Marland Kitchen

## 2019-11-26 LAB — NOVEL CORONAVIRUS, NAA: SARS-CoV-2, NAA: NOT DETECTED

## 2019-11-26 LAB — SARS-COV-2, NAA 2 DAY TAT

## 2019-11-28 DIAGNOSIS — J329 Chronic sinusitis, unspecified: Secondary | ICD-10-CM | POA: Diagnosis not present

## 2019-11-28 DIAGNOSIS — Z681 Body mass index (BMI) 19 or less, adult: Secondary | ICD-10-CM | POA: Diagnosis not present

## 2019-11-28 DIAGNOSIS — F909 Attention-deficit hyperactivity disorder, unspecified type: Secondary | ICD-10-CM | POA: Diagnosis not present

## 2019-12-27 DIAGNOSIS — R21 Rash and other nonspecific skin eruption: Secondary | ICD-10-CM | POA: Diagnosis not present

## 2019-12-27 DIAGNOSIS — Z6836 Body mass index (BMI) 36.0-36.9, adult: Secondary | ICD-10-CM | POA: Diagnosis not present

## 2019-12-27 DIAGNOSIS — F909 Attention-deficit hyperactivity disorder, unspecified type: Secondary | ICD-10-CM | POA: Diagnosis not present

## 2019-12-27 DIAGNOSIS — E6609 Other obesity due to excess calories: Secondary | ICD-10-CM | POA: Diagnosis not present

## 2020-03-01 ENCOUNTER — Other Ambulatory Visit: Payer: BC Managed Care – PPO

## 2020-03-01 DIAGNOSIS — Z20822 Contact with and (suspected) exposure to covid-19: Secondary | ICD-10-CM

## 2020-03-03 LAB — NOVEL CORONAVIRUS, NAA: SARS-CoV-2, NAA: NOT DETECTED

## 2020-03-03 LAB — SARS-COV-2, NAA 2 DAY TAT

## 2020-03-31 ENCOUNTER — Encounter: Payer: Self-pay | Admitting: Emergency Medicine

## 2020-03-31 ENCOUNTER — Ambulatory Visit
Admission: EM | Admit: 2020-03-31 | Discharge: 2020-03-31 | Disposition: A | Discharge status: Home / Self Care | Payer: BC Managed Care – PPO | Attending: Emergency Medicine | Admitting: Emergency Medicine

## 2020-03-31 ENCOUNTER — Other Ambulatory Visit: Payer: Self-pay

## 2020-03-31 DIAGNOSIS — J069 Acute upper respiratory infection, unspecified: Secondary | ICD-10-CM

## 2020-03-31 DIAGNOSIS — R112 Nausea with vomiting, unspecified: Secondary | ICD-10-CM | POA: Diagnosis not present

## 2020-03-31 DIAGNOSIS — R5383 Other fatigue: Secondary | ICD-10-CM | POA: Diagnosis not present

## 2020-03-31 DIAGNOSIS — R059 Cough, unspecified: Secondary | ICD-10-CM | POA: Diagnosis not present

## 2020-03-31 DIAGNOSIS — Z1152 Encounter for screening for COVID-19: Secondary | ICD-10-CM

## 2020-03-31 MED ORDER — ONDANSETRON 4 MG PO TBDP: 4.0000 mg | ORAL_TABLET | Freq: Three times a day (TID) | ORAL | 0 refills | Status: DC | PRN

## 2020-03-31 MED ORDER — BENZONATATE 100 MG PO CAPS: 100.0000 mg | ORAL_CAPSULE | Freq: Three times a day (TID) | ORAL | 0 refills | Status: DC | PRN

## 2020-03-31 MED ORDER — FLUTICASONE PROPIONATE 50 MCG/ACT NA SUSP: 1.0000 | Freq: Every day | NASAL | 0 refills | Status: DC

## 2020-03-31 MED ORDER — DEXAMETHASONE 4 MG PO TABS: 4.0000 mg | ORAL_TABLET | Freq: Every day | ORAL | 0 refills | Status: AC

## 2020-03-31 NOTE — Discharge Instructions (Signed)
COVID-19, flu A/B testing ordered.  It will take between 2-7 days for test results.  Someone will contact you regarding abnormal results.     Get plenty of rest and push fluids Tessalon Perles prescribed for cough Flonase was prescribed for middle ear effusion Decadron was prescribed Zofran was prescribed for nausea Decadron was prescribed Use medications daily for symptom relief Use OTC medications like ibuprofen or tylenol as needed fever or pain Call or go to the ED if you have any new or worsening symptoms such as fever, worsening cough, shortness of breath, chest tightness, chest pain, turning blue, changes in mental status, etc..Marland Kitchen

## 2020-03-31 NOTE — ED Triage Notes (Addendum)
Cough, vomiting,diarrhea , weakness. S/s started x 2 days ago

## 2020-03-31 NOTE — ED Provider Notes (Signed)
[place]   [id number] 03/31/20 Arrival Time: 9622   CC: COVID symptoms  SUBJECTIVE: History from: patient.  [name] is a 35 y.o. female who presented to the urgent care for complaint of fatigue cough, vomiting, diarrhea  for the past 2 days.  Denies sick exposure to COVID, flu or strep.  Denies recent travel.  Has tried OTC medication without relief.  Denies alleviating or aggravating factor.  Denies previous symptoms in the past.   Denies fever, chills, sinus pain, rhinorrhea, sore throat, SOB, wheezing, chest pain, nausea, changes in bowel or bladder habits.     ROS: As per HPI.  All other pertinent ROS negative.     Past Medical History:  Diagnosis Date  . ADHD (attention deficit hyperactivity disorder)   . Migraine    Past Surgical History:  Procedure Laterality Date  . CESAREAN SECTION    . CHOLECYSTECTOMY    . TUBAL LIGATION     Allergies  Allergen Reactions  . Prednisone Nausea And Vomiting   No current facility-administered medications on file prior to encounter.   Current Outpatient Medications on File Prior to Encounter  Medication Sig Dispense Refill  . amphetamine-dextroamphetamine (ADDERALL XR) 30 MG 24 hr capsule Take 30 mg by mouth daily.    Marland Kitchen amphetamine-dextroamphetamine (ADDERALL) 20 MG tablet Take 20 mg by mouth daily.    . ondansetron (ZOFRAN) 4 MG tablet Take 1 tablet (4 mg total) by mouth every 6 (six) hours. 12 tablet 0   Social History   Socioeconomic History  . Marital status: Married    Spouse name: Not on file  . Number of children: Not on file  . Years of education: Not on file  . Highest education level: Not on file  Occupational History  . Not on file  Tobacco Use  . Smoking status: Current Every Day Smoker    Packs/day: 0.50    Types: Cigarettes  . Smokeless tobacco: Never Used  Vaping Use  . Vaping Use: Never used  Substance and Sexual Activity  . Alcohol use: No  . Drug use: No  . Sexual activity: Yes     Birth control/protection: Surgical    Comment: tubal ligation  Other Topics Concern  . Not on file  Social History Narrative  . Not on file   Social Determinants of Health   Financial Resource Strain: Not on file  Food Insecurity: Not on file  Transportation Needs: Not on file  Physical Activity: Not on file  Stress: Not on file  Social Connections: Not on file  Intimate Partner Violence: Not on file   Family History  Problem Relation Age of Onset  . Diabetes Mother   . Hypertension Mother   . Asthma Mother   . Colon cancer Mother   . Cancer Mother 24       stage 4 kidney  . Ovarian cancer Mother   . Hypertension Father   . Hyperlipidemia Father   . Alcoholism Father   . Drug abuse Father   . Diabetes Maternal Grandmother   . Cancer Paternal Grandmother     OBJECTIVE:  Vitals:   03/31/20 1055  BP: (!) 143/90  Pulse: (!) 102  Resp: 19  Temp: 98.7 F (37.1 C)  TempSrc: Oral  SpO2: 94%     General appearance: alert; appears fatigued, but nontoxic; speaking in full sentences and tolerating own secretions HEENT: NCAT; Ears: EACs clear, TMs pearly gray; Eyes: PERRL.  EOM grossly intact. Sinuses: nontender; Nose:  nares patent without rhinorrhea, Throat: oropharynx clear, tonsils non erythematous or enlarged, uvula midline  Neck: supple without LAD Lungs: unlabored respirations, symmetrical air entry; cough: moderate; no respiratory distress; CTAB Heart: regular rate and rhythm.  Radial pulses 2+ symmetrical bilaterally Skin: warm and dry Psychological: alert and cooperative; normal mood and affect  LABS:  No results found for this or any previous visit (from the past 24 hour(s)).   ASSESSMENT & PLAN:  1. Encounter for screening for COVID-19   2. Viral URI with cough   3. Non-intractable vomiting with nausea, unspecified vomiting type   4. Other fatigue     Meds ordered this encounter  Medications  . benzonatate (TESSALON) 100 MG capsule    Sig: Take  1 capsule (100 mg total) by mouth 3 (three) times daily as needed for cough.    Dispense:  30 capsule    Refill:  0  . fluticasone (FLONASE) 50 MCG/ACT nasal spray    Sig: Place 1 spray into both nostrils daily for 14 days.    Dispense:  16 g    Refill:  0  . ondansetron (ZOFRAN ODT) 4 MG disintegrating tablet    Sig: Take 1 tablet (4 mg total) by mouth every 8 (eight) hours as needed for nausea or vomiting.    Dispense:  20 tablet    Refill:  0  . dexamethasone (DECADRON) 4 MG tablet    Sig: Take 1 tablet (4 mg total) by mouth daily for 7 days.    Dispense:  7 tablet    Refill:  0    Discharge instructions  COVID-19, flu A/B testing ordered.  It will take between 2-7 days for test results.  Someone will contact you regarding abnormal results.     Get plenty of rest and push fluids Tessalon Perles prescribed for cough Flonase was prescribed for middle ear effusion Decadron was prescribed Zofran was prescribed for nausea Decadron was prescribed Use medications daily for symptom relief Use OTC medications like ibuprofen or tylenol as needed fever or pain Call or go to the ED if you have any new or worsening symptoms such as fever, worsening cough, shortness of breath, chest tightness, chest pain, turning blue, changes in mental status, etc...   Reviewed expectations re: course of current medical issues. Questions answered. Outlined signs and symptoms indicating need for more acute intervention. Patient verbalized understanding. After Visit Summary given.         Emerson Monte, [place] 03/31/20 1121

## 2020-04-01 LAB — COVID-19, FLU A+B NAA
Influenza A, NAA: NOT DETECTED
Influenza B, NAA: NOT DETECTED
SARS-CoV-2, NAA: NOT DETECTED

## 2020-11-01 ENCOUNTER — Encounter: Payer: Self-pay | Admitting: Emergency Medicine

## 2020-11-01 ENCOUNTER — Ambulatory Visit
Admission: EM | Admit: 2020-11-01 | Discharge: 2020-11-01 | Disposition: A | Discharge status: Home / Self Care | Payer: PRIVATE HEALTH INSURANCE | Attending: Emergency Medicine | Admitting: Emergency Medicine

## 2020-11-01 ENCOUNTER — Other Ambulatory Visit: Payer: Self-pay

## 2020-11-01 DIAGNOSIS — R059 Cough, unspecified: Secondary | ICD-10-CM

## 2020-11-01 DIAGNOSIS — J019 Acute sinusitis, unspecified: Secondary | ICD-10-CM

## 2020-11-01 DIAGNOSIS — R0981 Nasal congestion: Secondary | ICD-10-CM

## 2020-11-01 MED ORDER — BENZONATATE 100 MG PO CAPS: 100.0000 mg | ORAL_CAPSULE | Freq: Three times a day (TID) | ORAL | 0 refills | Status: DC

## 2020-11-01 MED ORDER — AMOXICILLIN-POT CLAVULANATE 875-125 MG PO TABS: 1.0000 | ORAL_TABLET | Freq: Two times a day (BID) | ORAL | 0 refills | Status: AC

## 2020-11-01 NOTE — ED Triage Notes (Signed)
Sinus pressure that started on Thursday. Since Tue having cough, mid back pain and low fever. Pt states she feels like she has a sinus infection and needs an abx.

## 2020-11-01 NOTE — Discharge Instructions (Addendum)
Covid test ordered.  Someone will follow up with you regarding abnormal results Get plenty of rest and push fluids Augmentin for sinus infection Tessalon Perles prescribed for cough Use OTC zyrtec for nasal congestion, runny nose, and/or sore throat Use OTC flonase for nasal congestion and runny nose Use medications daily for symptom relief Use OTC medications like ibuprofen or tylenol as needed fever or pain Call or go to the ED if you have any new or worsening symptoms such as fever, worsening cough, shortness of breath, chest tightness, chest pain, turning blue, changes in mental status, etc..Marland Kitchen

## 2020-11-01 NOTE — ED Provider Notes (Signed)
[place]   [id number] 11/01/20 Arrival Time: [id number]   CC: sinus infection  SUBJECTIVE: History from: patient.  [name] is a 35 y.o. female who presents with sinus pain/ pressure x 1 week, low fever, back pain and cough x 2 days.  Denies sick exposure to COVID, flu or strep.  Has tried OTC medications without relief.  Denies aggravating factors.  Reports previous symptoms in the past with sinus infection.   Denies SOB, wheezing, chest pain, nausea, changes in bowel or bladder habits.    ROS: As per HPI.  All other pertinent ROS negative.     Past Medical History:  Diagnosis Date   ADHD (attention deficit hyperactivity disorder)    Migraine    Past Surgical History:  Procedure Laterality Date   CESAREAN SECTION     CHOLECYSTECTOMY     TUBAL LIGATION     Allergies  Allergen Reactions   Prednisone Nausea And Vomiting   No current facility-administered medications on file prior to encounter.   Current Outpatient Medications on File Prior to Encounter  Medication Sig Dispense Refill   amphetamine-dextroamphetamine (ADDERALL XR) 30 MG 24 hr capsule Take 30 mg by mouth daily.     amphetamine-dextroamphetamine (ADDERALL) 20 MG tablet Take 20 mg by mouth daily.     benzonatate (TESSALON) 100 MG capsule Take 1 capsule (100 mg total) by mouth 3 (three) times daily as needed for cough. 30 capsule 0   fluticasone (FLONASE) 50 MCG/ACT nasal spray Place 1 spray into both nostrils daily for 14 days. 16 g 0   ondansetron (ZOFRAN ODT) 4 MG disintegrating tablet Take 1 tablet (4 mg total) by mouth every 8 (eight) hours as needed for nausea or vomiting. 20 tablet 0   ondansetron (ZOFRAN) 4 MG tablet Take 1 tablet (4 mg total) by mouth every 6 (six) hours. 12 tablet 0   Social History   Socioeconomic History   Marital status: Married    Spouse name: Not on file   Number of children: Not on file   Years of education: Not on file   Highest education level: Not on file   Occupational History   Not on file  Tobacco Use   Smoking status: Every Day    Packs/day: 0.50    Types: Cigarettes   Smokeless tobacco: Never  Vaping Use   Vaping Use: Never used  Substance and Sexual Activity   Alcohol use: No   Drug use: No   Sexual activity: Yes    Birth control/protection: Surgical    Comment: tubal ligation  Other Topics Concern   Not on file  Social History Narrative   Not on file   Social Determinants of Health   Financial Resource Strain: Not on file  Food Insecurity: Not on file  Transportation Needs: Not on file  Physical Activity: Not on file  Stress: Not on file  Social Connections: Not on file  Intimate Partner Violence: Not on file   Family History  Problem Relation Age of Onset   Diabetes Mother    Hypertension Mother    Asthma Mother    Colon cancer Mother    Cancer Mother 89       stage 4 kidney   Ovarian cancer Mother    Hypertension Father    Hyperlipidemia Father    Alcoholism Father    Drug abuse Father    Diabetes Maternal Grandmother    Cancer Paternal Grandmother     OBJECTIVE:  Vitals:  11/01/20 0857  BP: (!) 153/98  Pulse: 75  Resp: 18  Temp: 98.4 F (36.9 C)  TempSrc: Oral  SpO2: 95%     General appearance: alert; appears mildly fatigued, but nontoxic; speaking in full sentences and tolerating own secretions HEENT: NCAT; Ears: EACs clear, TMs pearly gray; Eyes: PERRL.  EOM grossly intact. Sinuses: nontender; Nose: nares patent without rhinorrhea, Throat: oropharynx clear, tonsils non erythematous or enlarged, uvula midline  Neck: supple without LAD Lungs: unlabored respirations, symmetrical air entry; cough: absent; no respiratory distress; CTAB Heart: regular rate and rhythm.  Skin: warm and dry Psychological: alert and cooperative; normal mood and affect  ASSESSMENT & PLAN:  1. Cough   2. Sinus congestion   3. Acute non-recurrent sinusitis, unspecified location     Meds ordered this encounter   Medications   amoxicillin-clavulanate (AUGMENTIN) 875-125 MG tablet    Sig: Take 1 tablet by mouth every 12 (twelve) hours for 10 days.    Dispense:  20 tablet    Refill:  0    Order Specific Question:   Supervising Provider    Answer:   Raylene Everts [id number]   Covid test ordered.  Someone will follow up with you regarding abnormal results Get plenty of rest and push fluids Augmentin for sinus infection Tessalon Perles prescribed for cough Use OTC zyrtec for nasal congestion, runny nose, and/or sore throat Use OTC flonase for nasal congestion and runny nose Use medications daily for symptom relief Use OTC medications like ibuprofen or tylenol as needed fever or pain Call or go to the ED if you have any new or worsening symptoms such as fever, worsening cough, shortness of breath, chest tightness, chest pain, turning blue, changes in mental status, etc...   Reviewed expectations re: course of current medical issues. Questions answered. Outlined signs and symptoms indicating need for more acute intervention. Patient verbalized understanding. After Visit Summary given.          Lestine Box, PA-C 11/01/20 [phone]

## 2020-11-02 LAB — SARS-COV-2, NAA 2 DAY TAT

## 2020-11-02 LAB — NOVEL CORONAVIRUS, NAA: SARS-CoV-2, NAA: NOT DETECTED

## 2021-05-20 ENCOUNTER — Other Ambulatory Visit: Payer: Self-pay

## 2021-05-20 ENCOUNTER — Ambulatory Visit
Admission: EM | Admit: 2021-05-20 | Discharge: 2021-05-20 | Disposition: A | Discharge status: Home / Self Care | Payer: 59 | Attending: Student | Admitting: Student

## 2021-05-20 DIAGNOSIS — B9689 Other specified bacterial agents as the cause of diseases classified elsewhere: Secondary | ICD-10-CM | POA: Diagnosis present

## 2021-05-20 DIAGNOSIS — Z112 Encounter for screening for other bacterial diseases: Secondary | ICD-10-CM | POA: Insufficient documentation

## 2021-05-20 DIAGNOSIS — J028 Acute pharyngitis due to other specified organisms: Secondary | ICD-10-CM | POA: Insufficient documentation

## 2021-05-20 LAB — POCT RAPID STREP A (OFFICE): Rapid Strep A Screen: NEGATIVE

## 2021-05-20 MED ORDER — AZITHROMYCIN 250 MG PO TABS: 250.0000 mg | ORAL_TABLET | Freq: Every day | ORAL | 0 refills | Status: DC

## 2021-05-20 NOTE — ED Provider Notes (Signed)
[facility]    CSN: [id number] Arrival date & time: 05/20/21  1026      History   Chief Complaint Chief Complaint  Patient presents with   Headache   Dizziness    HPI [name] is a 36 y.o. female presenting with sore throat, headache, malaise, lightheadedness for 4 days.  Here today with son with similar symptoms and diagnosis of exudative tonsillitis.  Denies fever/chills at home.  Occasional nonproductive cough.  Some lightheadedness but no dizziness, vision changes, worst headache of life, chest pain, shortness of breath.  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 Authorizing Provider  azithromycin (ZITHROMAX Z-PAK) 250 MG tablet Take 1 tablet (250 mg total) by mouth daily. Two pills (500mg ) day 1. One pill per day (250mg ) days 2-5. 05/20/21  Yes Rhys Martini, PA-C  amphetamine-dextroamphetamine (ADDERALL XR) 30 MG 24 hr capsule Take 30 mg by mouth daily.    [provider]  amphetamine-dextroamphetamine (ADDERALL) 20 MG tablet Take 20 mg by mouth daily.    [provider]  benzonatate (TESSALON) 100 MG capsule Take 1 capsule (100 mg total) by mouth every 8 (eight) hours. 11/01/20   Wurst, [place], PA-C  fluticasone (FLONASE) 50 MCG/ACT nasal spray Place 1 spray into both nostrils daily for 14 days. 03/31/20 04/14/20  Avegno, Zachery Dakins, FNP  ondansetron (ZOFRAN ODT) 4 MG disintegrating tablet Take 1 tablet (4 mg total) by mouth every 8 (eight) hours as needed for nausea or vomiting. 03/31/20   Avegno, Zachery Dakins, FNP  ondansetron (ZOFRAN) 4 MG tablet Take 1 tablet (4 mg total) by mouth every 6 (six) hours. 11/24/19   Rennis Harding, PA-C    Family History Family History  Problem Relation Age of Onset   Diabetes Mother    Hypertension Mother    Asthma Mother    Colon cancer Mother    Cancer Mother 48       stage 4 kidney   Ovarian cancer Mother    Hypertension Father    Hyperlipidemia Father    Alcoholism Father    Drug abuse Father  Diabetes Maternal Grandmother    Cancer Paternal Grandmother     Social History Social History   Tobacco Use   Smoking status: Every Day    Packs/day: 0.50    Types: Cigarettes   Smokeless tobacco: Never  Vaping Use   Vaping Use: Never used  Substance Use Topics   Alcohol use: No   Drug use: No     Allergies   Prednisone   Review of Systems Review of Systems  Constitutional:  Negative for appetite change, chills and fever.  HENT:  Positive for congestion and sore throat. Negative for ear pain, rhinorrhea, sinus pressure and sinus pain.   Eyes:  Negative for redness and visual disturbance.  Respiratory:  Positive for cough. Negative for chest tightness, shortness of breath and wheezing.   Cardiovascular:  Negative for chest pain and palpitations.  Gastrointestinal:  Negative for abdominal pain, constipation, diarrhea, nausea and vomiting.  Genitourinary:  Negative for dysuria,  frequency and urgency.  Musculoskeletal:  Negative for myalgias.  Neurological:  Negative for dizziness, weakness and headaches.  Psychiatric/Behavioral:  Negative for confusion.   All other systems reviewed and are negative.   Physical Exam Triage Vital Signs ED Triage Vitals  Enc Vitals Group     BP 05/20/21 1038 128/87     Pulse Rate 05/20/21 1038 75     Resp 05/20/21 1038 18     Temp 05/20/21 1038 98.4 F (36.9 C)     Temp src --      SpO2 05/20/21 1038 95 %     Weight --      Height --      Head Circumference --      Peak Flow --      Pain Score 05/20/21 1036 4     Pain Loc --      Pain Edu? --      Excl. in GC? --    No data found.  Updated Vital Signs BP 128/87   Pulse 75   Temp 98.4 F (36.9 C)   Resp 18   LMP 05/14/2021   SpO2 95%   Visual Acuity Right Eye Distance:   Left Eye Distance:   Bilateral Distance:    Right Eye Near:   Left Eye Near:    Bilateral Near:     Physical Exam Vitals reviewed.  Constitutional:      General: She is not in acute distress.    Appearance: Normal appearance. She is not ill-appearing.  HENT:     Head: Normocephalic and atraumatic.     Right Ear: Tympanic membrane, ear canal and external ear normal. No tenderness. No middle ear effusion. There is no impacted cerumen. Tympanic membrane is not perforated, erythematous, retracted or bulging.     Left Ear: Tympanic membrane, ear canal and external ear normal. No tenderness.  No middle ear effusion. There is no impacted cerumen. Tympanic membrane is not perforated, erythematous, retracted or bulging.     Nose: Nose normal. No congestion.     Mouth/Throat:     Mouth: Mucous membranes are moist.     Pharynx: Uvula midline. Posterior oropharyngeal erythema present. No oropharyngeal exudate.     Comments: Smooth erythema posterior pharynx. Tonsils are small. On exam, uvula is midline, she is tolerating her secretions without difficulty, there is no trismus, no drooling, she has  normal phonation  Eyes:     Extraocular Movements: Extraocular movements intact.     Pupils: Pupils are equal, round, and reactive to  light.  Cardiovascular:     Rate and Rhythm: Normal rate and regular rhythm.     Heart sounds: Normal heart sounds.  Pulmonary:     Effort: Pulmonary effort is normal.     Breath sounds: Normal breath sounds. No decreased breath sounds, wheezing, rhonchi or rales.  Abdominal:     Palpations: Abdomen is soft.     Tenderness: There is no abdominal tenderness. There is no guarding or rebound.  Lymphadenopathy:     Cervical: No cervical adenopathy.     Right cervical: No superficial cervical adenopathy.    Left cervical: No superficial cervical adenopathy.  Neurological:     General: No focal deficit present.     Mental Status: She is alert and oriented to person, place, and time.  Psychiatric:        Mood and Affect: Mood normal.        Behavior: Behavior normal.        Thought Content: Thought content normal.        Judgment: Judgment normal.     UC Treatments / Results  Labs (all labs ordered are listed, but only abnormal results are displayed) Labs Reviewed  CULTURE, GROUP A STREP [facility])  POCT RAPID STREP A (OFFICE)    EKG   Radiology No results found.  Procedures Procedures (including critical care time)  Medications Ordered in UC Medications - No data to display  Initial Impression / Assessment and Plan / UC Course  I have reviewed the triage vital signs and the nursing notes.  Pertinent labs & imaging results that were available during my care of the patient were reviewed by me and considered in my medical decision making (see chart for details).     This patient is a very pleasant 36 y.o. year old female presenting with pharyngitis. Afebrile, nontachy. LMP 05/14/21, States she is not pregnant or breastfeeding.  Rapid strep negative, culture sent. Pt's son is also here today - centor score 5, rapid strep negative, suspicion  for bacterial pharyngitis of unknown etiology/ possibly H flu. Will manage pt with azithromycin to cover for this.   ED return precautions discussed. Patient verbalizes understanding and agreement.     Final Clinical Impressions(s) / UC Diagnoses   Final diagnoses:  Bacterial pharyngitis  Screening for streptococcal infection     Discharge Instructions      -I think that you have strep throat, but a type of bacteria that we do not test for.  We will treat this with a Z-Pak, once daily for 5 days. -I recommend throwing out your toothbrush after about 1 day so that you do not get this back to yourself. -Tylenol/ibuprofen, Mucinex, etc   ED Prescriptions     Medication Sig Dispense Auth. Provider   azithromycin (ZITHROMAX Z-PAK) 250 MG tablet Take 1 tablet (250 mg total) by mouth daily. Two pills (500mg ) day 1. One pill per day (250mg ) days 2-5. 6 tablet Rhys Martini, PA-C      PDMP not reviewed this encounter.   Rhys Martini, PA-C 05/20/21 1214

## 2021-05-20 NOTE — Discharge Instructions (Addendum)
-  I think that you have strep throat, but a type of bacteria that we do not test for.  We will treat this with a Z-Pak, once daily for 5 days. ?-I recommend throwing out your toothbrush after about 1 day so that you do not get this back to yourself. ?-Tylenol/ibuprofen, Mucinex, etc ?

## 2021-05-20 NOTE — ED Triage Notes (Signed)
Pt presents with c/o headache , dizziness and fatigue for past 4 days  ?

## 2021-05-23 LAB — CULTURE, GROUP A STREP (THRC)

## 2021-07-18 ENCOUNTER — Encounter: Payer: Self-pay | Admitting: Neurology

## 2021-07-26 ENCOUNTER — Other Ambulatory Visit: Payer: Self-pay | Admitting: Family Medicine

## 2021-07-26 ENCOUNTER — Other Ambulatory Visit (HOSPITAL_COMMUNITY): Payer: Self-pay | Admitting: Family Medicine

## 2021-07-26 DIAGNOSIS — G629 Polyneuropathy, unspecified: Secondary | ICD-10-CM

## 2021-07-26 DIAGNOSIS — R202 Paresthesia of skin: Secondary | ICD-10-CM

## 2021-07-31 ENCOUNTER — Other Ambulatory Visit: Payer: Self-pay | Admitting: Family Medicine

## 2021-07-31 ENCOUNTER — Other Ambulatory Visit (HOSPITAL_COMMUNITY): Payer: Self-pay | Admitting: Family Medicine

## 2021-07-31 DIAGNOSIS — R200 Anesthesia of skin: Secondary | ICD-10-CM

## 2021-07-31 DIAGNOSIS — R202 Paresthesia of skin: Secondary | ICD-10-CM

## 2021-07-31 DIAGNOSIS — G629 Polyneuropathy, unspecified: Secondary | ICD-10-CM

## 2021-08-16 ENCOUNTER — Ambulatory Visit (HOSPITAL_COMMUNITY)
Admission: RE | Admit: 2021-08-16 | Discharge: 2021-08-16 | Disposition: A | Discharge status: Home / Self Care | Payer: 59 | Source: Ambulatory Visit | Attending: Family Medicine | Admitting: Family Medicine

## 2021-08-16 DIAGNOSIS — R200 Anesthesia of skin: Secondary | ICD-10-CM | POA: Insufficient documentation

## 2021-08-16 DIAGNOSIS — R202 Paresthesia of skin: Secondary | ICD-10-CM | POA: Insufficient documentation

## 2021-08-16 DIAGNOSIS — G629 Polyneuropathy, unspecified: Secondary | ICD-10-CM | POA: Insufficient documentation

## 2021-08-16 MED ORDER — GADOBUTROL 1 MMOL/ML IV SOLN
10.0000 mL | Freq: Once | INTRAVENOUS | Status: AC | PRN
  Administered 2021-08-16: 10 mL via INTRAVENOUS

## 2021-08-19 ENCOUNTER — Other Ambulatory Visit: Payer: Self-pay | Admitting: Family Medicine

## 2021-08-19 ENCOUNTER — Other Ambulatory Visit (HOSPITAL_COMMUNITY): Payer: Self-pay | Admitting: Family Medicine

## 2021-08-19 DIAGNOSIS — D140 Benign neoplasm of middle ear, nasal cavity and accessory sinuses: Secondary | ICD-10-CM

## 2021-08-30 ENCOUNTER — Ambulatory Visit (HOSPITAL_COMMUNITY)
Admission: RE | Admit: 2021-08-30 | Discharge: 2021-08-30 | Disposition: A | Discharge status: Home / Self Care | Payer: 59 | Source: Ambulatory Visit | Attending: Family Medicine | Admitting: Family Medicine

## 2021-08-30 DIAGNOSIS — D140 Benign neoplasm of middle ear, nasal cavity and accessory sinuses: Secondary | ICD-10-CM | POA: Insufficient documentation

## 2021-08-30 MED ORDER — GADOBUTROL 1 MMOL/ML IV SOLN
9.0000 mL | Freq: Once | INTRAVENOUS | Status: AC | PRN
  Administered 2021-08-30: 9 mL via INTRAVENOUS

## 2021-10-29 NOTE — Progress Notes (Signed)
Initial neurology clinic note  SERVICE DATE: 11/01/21 SERVICE TIME: 9:30 am  Reason for Evaluation: Consultation requested by Jake Samples, PA* for an opinion regarding tingling in hands and feet and dizziness. My final recommendations will be communicated back to the requesting physician by way of shared medical record or letter to requesting physician via [place] mail.  HPI: This is Ms. [name], a 36 y.o. right-handed female with a medical history of ADHD, anxiety, and current smoker who presents to neurology clinic with the chief complaint of tingling in hands and feet and dizziness. The patient is alone today.  Patient has noticed symptoms for about 6 months. Her hands were cramping, tingling, and dropping things. She had previously had symptoms in her hands and told she might have carpal tunnel about 14 years ago. Those symptoms went away until 6 months ago. She has also noticed that when she raises her arms above her head that she gets dizzy. She feels lightheaded, sees spots. Her symptoms will resolve after lowering arms. She can have the dizziness at other times like if she bends over and comes back up. She also had an episode while walking and holding bags. She has frequent tingling and numbness around the left hip. She also feels like her legs may be restless, especially at night. She feels like her legs are like jello and she is consistently fatigued.  She has frequent headaches, 2-3 per week. It is both sides of the top of her head. It is a throbbing. She states it can turn into migraines (photophobia, phonophobia, nausea) rarely, once every couple of months. When she has a headache she takes 2-3 200 mg ibuprofen that helps with her headaches. She may have been on preventative medications years ago. She stopped that 10 years ago because she was losing a lot of weight.  She had outside labs that showed low B12 and vitamin D. She takes B12 shots monthly and taking daily vit D  (?5000 IU).  Dizziness is her biggest concern. Patient's mother passed away in May 04, 2017 from colon cancer (during surgery on kidney) and she found her medical records recently and saw mother was having the same symptoms. Patient is very concerned about cancer. Her father passed away at the age of 86. She thinks this was from bleeding ulcers due to drinking EtOH.  Patient has discussed this with her primary care, but told that cancer screening is not needed until she is 36 years old.  The patient denies symptoms suggestive of oculobulbar weakness including diplopia, ptosis, dysphagia, poor saliva control, dysarthria/dysphonia, impaired mastication, facial weakness/droop.  There are no neuromuscular respiratory weakness symptoms, particularly orthopnea>dyspnea.   She gets about 5 hours of sleep per night. She does snore. She wakes up feeling tired. She feels like she needs to nap throughout the day.  The patient does not report symptoms referable to autonomic dysfunction including impaired sweating, heat or cold intolerance, excessive mucosal dryness, gastroparetic early satiety, postprandial abdominal bloating, bowel or bladder dyscontrol, or syncope/presyncope/orthostatic intolerance. She endorses constipation.  The patient has not noticed any recent skin rashes nor does she report any constitutional symptoms like fever, night sweats, anorexia or unintentional weight loss. She has gained a lot of weight. She also endorses more stress.  EtOH use: very rare  Restrictive diet? No Family history of neuropathy/myopathy/NM disease? Mother had similar symptoms to her, but no known neurologic disorders   MEDICATIONS:  Outpatient Encounter Medications as of 11/01/2021  Medication Sig  amphetamine-dextroamphetamine (ADDERALL XR) 30 MG 24 hr capsule Take 30 mg by mouth daily.   amphetamine-dextroamphetamine (ADDERALL) 20 MG tablet Take 20 mg by mouth daily.   Vitamin D, Ergocalciferol, (DRISDOL) 1.25 MG  (50000 UNIT) CAPS capsule Take 50,000 Units by mouth once a week.   [DISCONTINUED] azithromycin (ZITHROMAX Z-PAK) 250 MG tablet Take 1 tablet (250 mg total) by mouth daily. Two pills ('500mg'$ ) day 1. One pill per day ('250mg'$ ) days 2-5. (Patient not taking: Reported on 11/01/2021)   [DISCONTINUED] benzonatate (TESSALON) 100 MG capsule Take 1 capsule (100 mg total) by mouth every 8 (eight) hours. (Patient not taking: Reported on 11/01/2021)   [DISCONTINUED] fluticasone (FLONASE) 50 MCG/ACT nasal spray Place 1 spray into both nostrils daily for 14 days.   [DISCONTINUED] ondansetron (ZOFRAN ODT) 4 MG disintegrating tablet Take 1 tablet (4 mg total) by mouth every 8 (eight) hours as needed for nausea or vomiting. (Patient not taking: Reported on 11/01/2021)   [DISCONTINUED] ondansetron (ZOFRAN) 4 MG tablet Take 1 tablet (4 mg total) by mouth every 6 (six) hours. (Patient not taking: Reported on 11/01/2021)   No facility-administered encounter medications on file as of 11/01/2021.    PAST MEDICAL HISTORY: Past Medical History:  Diagnosis Date   ADHD (attention deficit hyperactivity disorder)    Migraine     PAST SURGICAL HISTORY: Past Surgical History:  Procedure Laterality Date   CESAREAN SECTION     CHOLECYSTECTOMY     TUBAL LIGATION      ALLERGIES: Allergies  Allergen Reactions   Prednisone Nausea And Vomiting    FAMILY HISTORY: Family History  Problem Relation Age of Onset   Diabetes Mother    Hypertension Mother    Asthma Mother    Colon cancer Mother    Cancer Mother 61       stage 76 kidney   Ovarian cancer Mother    Hypertension Father    Hyperlipidemia Father    Alcoholism Father    Drug abuse Father    Diabetes Maternal Grandmother    Cancer Paternal Grandmother     SOCIAL HISTORY: Social History   Tobacco Use   Smoking status: Every Day    Packs/day: 0.50    Types: Cigarettes   Smokeless tobacco: Never   Tobacco comments:    3/4 of pack a day  Vaping Use   Vaping  Use: Never used  Substance Use Topics   Alcohol use: No   Drug use: No   Social History   Social History Narrative   Right handed    Caffeine 2 big tea a day    Live in a one story home     OBJECTIVE: PHYSICAL EXAM: BP 127/88   Pulse 91   Ht '5\' 1"'$  (1.549 m)   Wt 191 lb (86.6 kg)   SpO2 97%   BMI 36.09 kg/m   General: General appearance: Awake and alert. No distress. Cooperative with exam.  Skin: No obvious rash or jaundice. HEENT: Atraumatic. Anicteric. Lungs: Non-labored breathing on room air  Heart: Regular, radial pulses intact, no change when lifting arms above head. No carotid bruits. Extremities: No edema. No obvious deformity.  Musculoskeletal: No obvious joint swelling. Tenderness to palpation of the cervical spine, limited range of motion, muscle tightness Psych: Affect appropriate.  Neurological: Mental Status: Alert. Speech fluent. No pseudobulbar affect Cranial Nerves: CNII: No RAPD. Visual fields intact. CNIII, IV, VI: PERRL. No nystagmus. EOMI. CN V: Facial sensation intact bilaterally to fine touch. CN VII: Facial  muscles symmetric and strong. No ptosis at rest. CN VIII: Hears finger rub well bilaterally. CN IX: No hypophonia. CN X: Palate elevates symmetrically. CN XI: Full strength shoulder shrug bilaterally. CN XII: Tongue protrusion full and midline. No atrophy or fasciculations. No significant dysarthria Motor: Tone is normal. No fasciculations in extremities. No atrophy.  Individual muscle group testing (MRC grade out of 5):  Movement     Neck flexion 5    Neck extension 5     Right Left   Shoulder abduction 5 5   Shoulder adduction 5 5   Shoulder ext rotation 5 5   Shoulder int rotation 5 5   Elbow flexion 5 5   Elbow extension 5 5   Wrist extension 5 5   Wrist flexion 5 5   Finger abduction - FDI 5 5   Finger abduction - ADM 5 5   Finger extension 5 5   Finger distal flexion - 2/'3 5 5   '$ Finger distal flexion - 4/'5 5 5    '$ Thumb flexion - FPL 5 5   Thumb abduction - APB 5 5    Hip flexion 5 5   Hip extension 5 5   Hip adduction 5 5   Hip abduction 5 5   Knee extension 5 5   Knee flexion 5 5   Dorsiflexion 5 5   Plantarflexion 5 5     Reflexes:  Right Left   Bicep 2+ 2+   Tricep 2+ 2+   BrRad 2+ 2+   Knee 2+ 2+   Ankle 2+ 2+    Pathological Reflexes: Hoffman: absent bilaterally Troemner: absent bilaterally  Sensation: Intact in all extremities to light touch. Phalen test negative. Tinel's test positive bilaterally over the carpal tunnel/median nerve  Coordination: Intact finger-to- nose-finger bilaterally. Romberg negative. Gait: Able to rise from chair with arms crossed unassisted. Normal, narrow-based gait. Able to tandem walk. Able to walk on toes and heels.  Lab and Test Review: Internal labs: 01/11/16: Vit D: 21 (low) SPEP: normal RF: elevated at 25 ANA: neg CK: 84  External labs: 07/06/21: CMP, CBC, and TSH unremarkable B12 and vit D low per patient report (?)  MRI brain w/wo contrast (08/16/21): Cerebral volume is normal.   1-2 mm nodular appearing focus of enhancement within the left internal auditory canal (series 18, image 43).   No cortical encephalomalacia is identified. No significant cerebral white matter disease.   There is no acute infarct.   No chronic intracranial blood products.   No extra-axial fluid collection.   No midline shift.   Vascular: Maintained flow voids within the proximal large arterial vessels.   Skull and upper cervical spine: No focal suspicious marrow lesion.   Sinuses/Orbits: No mass or acute finding within the imaged orbits. Trace mucosal thickening within the right sphenoid sinus.   Impression #1 will be called to the ordering clinician or representative by the Radiologist Assistant, and communication documented in the PACS or [employer].   IMPRESSION: 1-2 mm nodular appearing focus of enhancement within the  left internal auditory canal. While this may reflect asymmetric vascular enhancement, an internal auditory canal protocol brain MRI without and with contrast is recommended to exclude a small vestibular schwannoma at this site.   Otherwise unremarkable MRI appearance of the brain.  Follow up MRI brain w/wo contrast (08/30/21): FINDINGS: Brain: There is no cerebellopontine angle mass. Inner ear structures demonstrate an unremarkable MR appearance. There is no abnormal enhancement within the  internal auditory canals.   No acute infarction or intracranial hemorrhage. There is no mass effect or edema. There is no extra-axial fluid collection. Ventricles and sulci are normal in size and configuration.   Vascular: Major vessel flow voids at the skull base are preserved.   Skull and upper cervical spine: Normal marrow signal is preserved.   Sinuses/Orbits: Paranasal sinuses are clear. The orbits are unremarkable.   Other: The sella is unremarkable.  Mastoid air cells are clear.   IMPRESSION: Normal study. The questioned abnormality on the prior study is vascular.   ASSESSMENT: [name] is a 36 y.o. female who presents for evaluation of dizziness, headaches, neck pain, numbness and tingling in hands, and numbness over left lateral thigh. She has a relevant medical history of ADHD, anxiety, and is a current smoker. Her neurological examination is pertinent for positive Tinel's test over bilateral carpal tunnel, otherwise unremarkable. Available diagnostic data is significant for reportedly low B12 and vit D for which she is taking supplementation.   Patient's dizziness/lightheadness and headaches are most likely secondary to her neck pain. She has significant tightness and tenderness to palpation in the cervical spine. The numbness and tingling in her hands are consistent with carpal tunnel. She has wrist splints, but wears them inconsistently. I encouraged her to use, especially  every night. 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 We discussed that weight loss would help with these symptoms. Finally, patient also mentioned her legs feel restless, particularly at night. I will obtain iron studies to screen for possible risk factors for restless leg syndrome.  PLAN: -Blood work: iron studies -Physical therapy for neck pain -Will consider HA preventative if PT does not help (Topamax vs nortriptyline, though nortriptyline may interact with Aderall) -Cock up wrist splints for CTS symptoms -Discussed weight loss would likely help with left lateral thigh numbness  -Return to clinic in 3 months  The impression above as well as the plan as outlined below were extensively discussed with the patient who voiced understanding. All questions were answered to their satisfaction.  When available, results of the above investigations and possible further recommendations will be communicated to the patient via telephone/MyChart. Patient to call office if not contacted after expected testing turnaround time.   Total time spent reviewing records, interview, history/exam, documentation, and coordination of care on day of encounter:  65 min   Thank you for allowing me to participate in patient's care.  If I can answer any additional questions, I would be pleased to do so.  Kai Levins, MD   CC: Sharilyn Sites, [place] [id number]  CC: Referring provider: Jake Samples, PA-C [address] [place],  [place] [address]

## 2021-11-01 ENCOUNTER — Encounter: Payer: Self-pay | Admitting: Neurology

## 2021-11-01 ENCOUNTER — Ambulatory Visit (INDEPENDENT_AMBULATORY_CARE_PROVIDER_SITE_OTHER): Payer: BC Managed Care – PPO | Admitting: Neurology

## 2021-11-01 ENCOUNTER — Other Ambulatory Visit (INDEPENDENT_AMBULATORY_CARE_PROVIDER_SITE_OTHER): Payer: BC Managed Care – PPO

## 2021-11-01 VITALS — BP 127/88 | HR 91 | Ht 61.0 in | Wt 191.0 lb

## 2021-11-01 DIAGNOSIS — R209 Unspecified disturbances of skin sensation: Secondary | ICD-10-CM

## 2021-11-01 DIAGNOSIS — G4486 Cervicogenic headache: Secondary | ICD-10-CM | POA: Diagnosis not present

## 2021-11-01 DIAGNOSIS — R202 Paresthesia of skin: Secondary | ICD-10-CM

## 2021-11-01 DIAGNOSIS — R200 Anesthesia of skin: Secondary | ICD-10-CM

## 2021-11-01 DIAGNOSIS — M542 Cervicalgia: Secondary | ICD-10-CM

## 2021-11-01 DIAGNOSIS — R42 Dizziness and giddiness: Secondary | ICD-10-CM | POA: Diagnosis not present

## 2021-11-01 DIAGNOSIS — G43009 Migraine without aura, not intractable, without status migrainosus: Secondary | ICD-10-CM

## 2021-11-01 NOTE — Patient Instructions (Addendum)
I think the numbness in your hands is due to carpal tunnel. I encourage you to wear your wrist splints, particularly at night.  I think your headaches and dizziness are because of your neck pain and muscle tightness. I would like to start with physical therapy for this and see if we can improve both symptoms.  I am not worried about a bad neurologic disease.  I will be in touch when I have the results of your testing. I want to see you again in 3 months to discuss your progress. Please let me know if you have any questions or concerns in the meantime.   The physicians and staff at [facility] are committed to providing excellent care. You may receive a survey requesting feedback about your experience at our office. We strive to receive "very good" responses to the survey questions. If you feel that your experience would prevent you from giving the office a "very good " response, please contact our office to try to remedy the situation. We may be reached at [phone]. Thank you for taking the time out of your busy day to complete the survey.  Kai Levins, MD

## 2021-11-02 LAB — IRON,TIBC AND FERRITIN PANEL
%SAT: 9 % (calc) — ABNORMAL LOW (ref 16–45)
Ferritin: 23 ng/mL (ref 16–154)
Iron: 32 ug/dL — ABNORMAL LOW (ref 40–190)
TIBC: 358 mcg/dL (calc) (ref 250–450)

## 2021-11-05 ENCOUNTER — Encounter: Payer: Self-pay | Admitting: Neurology

## 2022-01-28 NOTE — Progress Notes (Deleted)
NEUROLOGY FOLLOW UP OFFICE NOTE  [name] [id number]  Subjective:  [name] is a 36 y.o. year old female with a history of ADHD, anxiety, and current smoker who we last saw on 11/01/21.  To briefly review: Patient has noticed symptoms for about 6 months. Her hands were cramping, tingling, and dropping things. She had previously had symptoms in her hands and told she might have carpal tunnel about 14 years ago. Those symptoms went away until 6 months ago. She has also noticed that when she raises her arms above her head that she gets dizzy. She feels lightheaded, sees spots. Her symptoms will resolve after lowering arms. She can have the dizziness at other times like if she bends over and comes back up. She also had an episode while walking and holding bags. She has frequent tingling and numbness around the left hip. She also feels like her legs may be restless, especially at night. She feels like her legs are like jello and she is consistently fatigued.   She has frequent headaches, 2-3 per week. It is both sides of the top of her head. It is a throbbing. She states it can turn into migraines (photophobia, phonophobia, nausea) rarely, once every couple of months. When she has a headache she takes 2-3 200 mg ibuprofen that helps with her headaches. She may have been on preventative medications years ago. She stopped that 10 years ago because she was losing a lot of weight.   She had outside labs that showed low B12 and vitamin D. She takes B12 shots monthly and taking daily vit D (?5000 IU).   Dizziness is her biggest concern. Patient's mother passed away in 05/02/17 from colon cancer (during surgery on kidney) and she found her medical records recently and saw mother was having the same symptoms. Patient is very concerned about cancer. Her father passed away at the age of 76. She thinks this was from bleeding ulcers due to drinking EtOH.   Patient has discussed this with her primary  care, but told that cancer screening is not needed until she is 36 years old.  She gets about 5 hours of sleep per night. She does snore. She wakes up feeling tired. She feels like she needs to nap throughout the day.   She has gained a lot of weight. She also endorses more stress.   EtOH use: very rare  Restrictive diet? No Family history of neuropathy/myopathy/NM disease? Mother had similar symptoms to her, but no known neurologic disorders  11/01/21 Assessment: Patient's dizziness/lightheadness and headaches are most likely secondary to her neck pain. She has significant tightness and tenderness to palpation in the cervical spine. The numbness and tingling in her hands are consistent with carpal tunnel. She has wrist splints, but wears them inconsistently. I encouraged her to use, especially every night. 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 We discussed that weight loss would help with these symptoms. Finally, patient also mentioned her legs feel restless, particularly at night. I will obtain iron studies to screen for possible risk factors for restless leg syndrome.   PLAN: -Blood work: iron studies -Physical therapy for neck pain -Will consider HA preventative if PT does not help (Topamax vs nortriptyline, though nortriptyline may interact with Aderall) -Cock up wrist splints for CTS symptoms -Discussed weight loss would likely help with left lateral thigh numbness  Since their last  visit: ***  Iron studies showed low iron, sat %, and ferritin of 23. I recommended patient take ferrous sulfate 325 mg, vit C 100-200 mg with each ferrous sulfate dose (or glass of orange juice).***  Wrist splint?***  PT? Help with neck pain/headaches?***  MEDICATIONS:  Outpatient Encounter Medications as of 01/31/2022  Medication Sig   amphetamine-dextroamphetamine (ADDERALL XR) 30 MG 24  hr capsule Take 30 mg by mouth daily.   amphetamine-dextroamphetamine (ADDERALL) 20 MG tablet Take 20 mg by mouth daily.   Vitamin D, Ergocalciferol, (DRISDOL) 1.25 MG (50000 UNIT) CAPS capsule Take 50,000 Units by mouth once a week.   No facility-administered encounter medications on file as of 01/31/2022.    PAST MEDICAL HISTORY: Past Medical History:  Diagnosis Date   ADHD (attention deficit hyperactivity disorder)    Migraine     PAST SURGICAL HISTORY: Past Surgical History:  Procedure Laterality Date   CESAREAN SECTION     CHOLECYSTECTOMY     TUBAL LIGATION      ALLERGIES: Allergies  Allergen Reactions   Prednisone Nausea And Vomiting    FAMILY HISTORY: Family History  Problem Relation Age of Onset   Diabetes Mother    Hypertension Mother    Asthma Mother    Colon cancer Mother    Cancer Mother 70       stage 31 kidney   Ovarian cancer Mother    Hypertension Father    Hyperlipidemia Father    Alcoholism Father    Drug abuse Father    Diabetes Maternal Grandmother    Cancer Paternal Grandmother     SOCIAL HISTORY: Social History   Tobacco Use   Smoking status: Every Day    Packs/day: 0.50    Types: Cigarettes   Smokeless tobacco: Never   Tobacco comments:    3/4 of pack a day  Vaping Use   Vaping Use: Never used  Substance Use Topics   Alcohol use: No   Drug use: No   Social History   Social History Narrative   Right handed    Caffeine 2 big tea a day    Live in a one story home      Objective:  Vital Signs:  There were no vitals taken for this visit.  ***  Labs and Imaging review: New results: Iron studies (11/01/21): iron low at 32, TIBC 358 (wnl), sat % low at 9, ferritin 23  Previously reviewed results: Internal labs: 01/11/16: Vit D: 21 (low) SPEP: normal RF: elevated at 25 ANA: neg CK: 84   External labs: 07/06/21: CMP, CBC, and TSH unremarkable B12 and vit D low per patient report (?)   MRI brain w/wo contrast  (08/16/21): Cerebral volume is normal.   1-2 mm nodular appearing focus of enhancement within the left internal auditory canal (series 18, image 43).   No cortical encephalomalacia is identified. No significant cerebral white matter disease.   There is no acute infarct.   No chronic intracranial blood products.   No extra-axial fluid collection.   No midline shift.   Vascular: Maintained flow voids within the proximal large arterial vessels.   Skull and upper cervical spine: No focal suspicious marrow lesion.   Sinuses/Orbits: No mass or acute finding within the imaged orbits. Trace mucosal thickening within the right sphenoid sinus.   Impression #1 will be called to the ordering clinician or representative by the Radiologist Assistant, and communication documented in the PACS or [employer].   IMPRESSION: 1-2 mm nodular  appearing focus of enhancement within the left internal auditory canal. While this may reflect asymmetric vascular enhancement, an internal auditory canal protocol brain MRI without and with contrast is recommended to exclude a small vestibular schwannoma at this site.   Otherwise unremarkable MRI appearance of the brain.   Follow up MRI brain w/wo contrast (08/30/21): FINDINGS: Brain: There is no cerebellopontine angle mass. Inner ear structures demonstrate an unremarkable MR appearance. There is no abnormal enhancement within the internal auditory canals.   No acute infarction or intracranial hemorrhage. There is no mass effect or edema. There is no extra-axial fluid collection. Ventricles and sulci are normal in size and configuration.   Vascular: Major vessel flow voids at the skull base are preserved.   Skull and upper cervical spine: Normal marrow signal is preserved.   Sinuses/Orbits: Paranasal sinuses are clear. The orbits are unremarkable.   Other: The sella is unremarkable.  Mastoid air cells are clear.   IMPRESSION: Normal  study. The questioned abnormality on the prior study is vascular.  Assessment/Plan:  This is [name], a 36 y.o. 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 -Recheck iron studies -Continue wrist splints -Pt has low ferritin/iron deficiency likely causing RLS symptoms.    -Continue ferrous sulfate 325 mg daily x 3 months.    -Discussed not to take with milk.    -Discussed that this can cause constipation, drink plenty of water.   -take vitamin C, 100-200 mg with each ferrous sulfate dose, or small glass of orange juice (if not diabetic)            Return to clinic in ***  Total time spent reviewing records, interview, history/exam, documentation, and coordination of care on day of encounter:  *** min  Kai Levins, MD  CC: ***

## 2022-01-31 ENCOUNTER — Encounter: Payer: Self-pay | Admitting: Neurology

## 2022-01-31 ENCOUNTER — Ambulatory Visit: Payer: BC Managed Care – PPO | Admitting: Neurology

## 2022-01-31 DIAGNOSIS — Z029 Encounter for administrative examinations, unspecified: Secondary | ICD-10-CM

## 2023-01-06 ENCOUNTER — Other Ambulatory Visit (HOSPITAL_COMMUNITY)
Admission: RE | Admit: 2023-01-06 | Discharge: 2023-01-06 | Disposition: A | Discharge status: Home / Self Care | Payer: BC Managed Care – PPO | Source: Ambulatory Visit | Attending: Family Medicine | Admitting: Family Medicine

## 2023-01-06 DIAGNOSIS — H6520 Chronic serous otitis media, unspecified ear: Secondary | ICD-10-CM | POA: Diagnosis not present

## 2023-01-06 DIAGNOSIS — G629 Polyneuropathy, unspecified: Secondary | ICD-10-CM | POA: Diagnosis not present

## 2023-01-06 DIAGNOSIS — E538 Deficiency of other specified B group vitamins: Secondary | ICD-10-CM | POA: Insufficient documentation

## 2023-01-06 DIAGNOSIS — E6609 Other obesity due to excess calories: Secondary | ICD-10-CM | POA: Diagnosis not present

## 2023-01-06 DIAGNOSIS — F909 Attention-deficit hyperactivity disorder, unspecified type: Secondary | ICD-10-CM | POA: Diagnosis not present

## 2023-01-06 DIAGNOSIS — F419 Anxiety disorder, unspecified: Secondary | ICD-10-CM | POA: Diagnosis not present

## 2023-01-06 DIAGNOSIS — Z6830 Body mass index (BMI) 30.0-30.9, adult: Secondary | ICD-10-CM | POA: Diagnosis not present

## 2023-01-06 LAB — IRON AND TIBC
Iron: 46 ug/dL (ref 28–170)
Saturation Ratios: 11 % (ref 10.4–31.8)
TIBC: 427 ug/dL (ref 250–450)
UIBC: 381 ug/dL

## 2023-01-06 LAB — COMPREHENSIVE METABOLIC PANEL
ALT: 24 U/L (ref 0–44)
AST: 16 U/L (ref 15–41)
Albumin: 4.8 g/dL (ref 3.5–5.0)
Alkaline Phosphatase: 37 U/L — ABNORMAL LOW (ref 38–126)
Anion gap: 9 (ref 5–15)
BUN: 14 mg/dL (ref 6–20)
CO2: 23 mmol/L (ref 22–32)
Calcium: 9.6 mg/dL (ref 8.9–10.3)
Chloride: 100 mmol/L (ref 98–111)
Creatinine, Ser: 0.79 mg/dL (ref 0.44–1.00)
GFR, Estimated: >60 mL/min (ref >60)
Glucose, Bld: 107 mg/dL — ABNORMAL HIGH (ref 70–99)
Potassium: 4.3 mmol/L (ref 3.5–5.1)
Sodium: 132 mmol/L — ABNORMAL LOW (ref 135–145)
Total Bilirubin: 0.5 mg/dL (ref <1.2)
Total Protein: 8 g/dL (ref 6.5–8.1)

## 2023-01-06 LAB — CBC WITH DIFFERENTIAL/PLATELET
Abs Immature Granulocytes: 0.02 10*3/uL (ref 0.00–0.07)
Basophils Absolute: 0.1 10*3/uL (ref 0.0–0.1)
Basophils Relative: 1 %
Eosinophils Absolute: 0.2 10*3/uL (ref 0.0–0.5)
Eosinophils Relative: 2 %
HCT: 49.3 % — ABNORMAL HIGH (ref 36.0–46.0)
Hemoglobin: 16.5 g/dL — ABNORMAL HIGH (ref 12.0–15.0)
Immature Granulocytes: 0 %
Lymphocytes Relative: 22 %
Lymphs Abs: 2.2 10*3/uL (ref 0.7–4.0)
MCH: 30.8 pg (ref 26.0–34.0)
MCHC: 33.5 g/dL (ref 30.0–36.0)
MCV: 92 fL (ref 80.0–100.0)
Monocytes Absolute: 0.5 10*3/uL (ref 0.1–1.0)
Monocytes Relative: 5 %
Neutro Abs: 6.8 10*3/uL (ref 1.7–7.7)
Neutrophils Relative %: 70 %
Platelets: 321 10*3/uL (ref 150–400)
RBC: 5.36 MIL/uL — ABNORMAL HIGH (ref 3.87–5.11)
RDW: 12.3 % (ref 11.5–15.5)
WBC: 9.7 10*3/uL (ref 4.0–10.5)
nRBC: 0 % (ref 0.0–0.2)

## 2023-01-06 LAB — RETICULOCYTES
Immature Retic Fract: 4.2 % (ref 2.3–15.9)
RBC.: 5.4 MIL/uL — ABNORMAL HIGH (ref 3.87–5.11)
Retic Count, Absolute: 57.8 10*3/uL (ref 19.0–186.0)
Retic Ct Pct: 1.1 % (ref 0.4–3.1)

## 2023-01-06 LAB — TSH: TSH: 1.679 u[IU]/mL (ref 0.350–4.500)

## 2023-01-06 LAB — LIPID PANEL
Cholesterol: 169 mg/dL (ref 0–200)
HDL: 66 mg/dL (ref >40)
LDL Cholesterol: 91 mg/dL (ref 0–99)
Total CHOL/HDL Ratio: 2.6 {ratio}
Triglycerides: 58 mg/dL (ref <150)
VLDL: 12 mg/dL (ref 0–40)

## 2023-01-06 LAB — FERRITIN: Ferritin: 21 ng/mL (ref 11–307)

## 2023-01-06 LAB — FOLATE: Folate: 14.5 ng/mL (ref >5.9)

## 2023-01-06 LAB — VITAMIN B12: Vitamin B-12: 402 pg/mL (ref 180–914)

## 2023-01-06 LAB — VITAMIN D 25 HYDROXY (VIT D DEFICIENCY, FRACTURES): Vit D, 25-Hydroxy: 38.95 ng/mL (ref 30–100)

## 2023-01-07 LAB — MISC LABCORP TEST (SEND OUT): Labcorp test code: 164065

## 2023-01-08 DIAGNOSIS — T380X5A Adverse effect of glucocorticoids and synthetic analogues, initial encounter: Secondary | ICD-10-CM | POA: Diagnosis not present

## 2023-01-08 DIAGNOSIS — E6609 Other obesity due to excess calories: Secondary | ICD-10-CM | POA: Diagnosis not present

## 2023-01-08 DIAGNOSIS — Z6830 Body mass index (BMI) 30.0-30.9, adult: Secondary | ICD-10-CM | POA: Diagnosis not present

## 2023-01-08 DIAGNOSIS — G629 Polyneuropathy, unspecified: Secondary | ICD-10-CM | POA: Diagnosis not present

## 2023-01-09 LAB — ALLERGENS W/TOTAL IGE AREA 2

## 2023-01-09 LAB — MISC LABCORP TEST (SEND OUT): Labcorp test code: 602989

## 2023-04-03 DIAGNOSIS — F909 Attention-deficit hyperactivity disorder, unspecified type: Secondary | ICD-10-CM | POA: Diagnosis not present

## 2023-06-26 DIAGNOSIS — Z6830 Body mass index (BMI) 30.0-30.9, adult: Secondary | ICD-10-CM | POA: Diagnosis not present

## 2023-06-26 DIAGNOSIS — F909 Attention-deficit hyperactivity disorder, unspecified type: Secondary | ICD-10-CM | POA: Diagnosis not present

## 2023-06-26 DIAGNOSIS — E6609 Other obesity due to excess calories: Secondary | ICD-10-CM | POA: Diagnosis not present

## 2023-07-14 DIAGNOSIS — Z20828 Contact with and (suspected) exposure to other viral communicable diseases: Secondary | ICD-10-CM | POA: Diagnosis not present

## 2023-07-14 DIAGNOSIS — Z6830 Body mass index (BMI) 30.0-30.9, adult: Secondary | ICD-10-CM | POA: Diagnosis not present

## 2023-07-14 DIAGNOSIS — E6609 Other obesity due to excess calories: Secondary | ICD-10-CM | POA: Diagnosis not present

## 2023-07-14 DIAGNOSIS — R6889 Other general symptoms and signs: Secondary | ICD-10-CM | POA: Diagnosis not present

## 2023-07-14 DIAGNOSIS — G629 Polyneuropathy, unspecified: Secondary | ICD-10-CM | POA: Diagnosis not present

## 2023-07-14 DIAGNOSIS — B349 Viral infection, unspecified: Secondary | ICD-10-CM | POA: Diagnosis not present

## 2023-07-24 DIAGNOSIS — M76822 Posterior tibial tendinitis, left leg: Secondary | ICD-10-CM | POA: Diagnosis not present

## 2023-10-02 DIAGNOSIS — F909 Attention-deficit hyperactivity disorder, unspecified type: Secondary | ICD-10-CM | POA: Diagnosis not present

## 2024-01-04 DIAGNOSIS — B9689 Other specified bacterial agents as the cause of diseases classified elsewhere: Secondary | ICD-10-CM | POA: Diagnosis not present

## 2024-01-04 DIAGNOSIS — J329 Chronic sinusitis, unspecified: Secondary | ICD-10-CM | POA: Diagnosis not present

## 2024-01-04 DIAGNOSIS — F902 Attention-deficit hyperactivity disorder, combined type: Secondary | ICD-10-CM | POA: Diagnosis not present

## 2024-02-25 IMAGING — MR MR HEAD WO/W CM
15 of 16 series · 39 of 48 positions shown · IV contrast (10 ml Gadavist)
Comparison: No pertinent prior exams available for comparison.

CLINICAL DATA: Provided history: Neuropathy. Numbness and tingling.
Paresthesia of skin.

EXAM:
MRI HEAD WITHOUT AND WITH CONTRAST
TECHNIQUE: Multiplanar, multiecho pulse sequences of the brain and surrounding
structures were obtained without and with intravenous contrast.
CONTRAST:  10mL GADAVIST GADOBUTROL 1 MMOL/ML IV SOLN

[Series 5: DWI · axial · 3.0mm · 0.77mm/px · z∈[-63,+80]mm · 4 of 50 slices shown (1 of 6)]
[im 1/50]
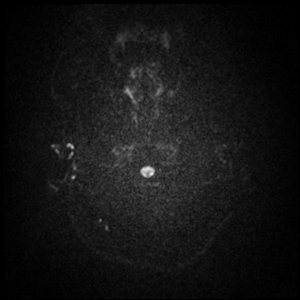
[im 17/50]
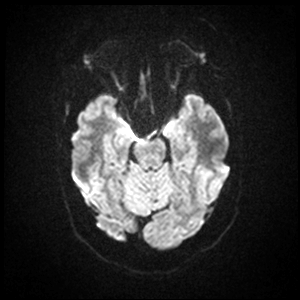
[im 33/50]
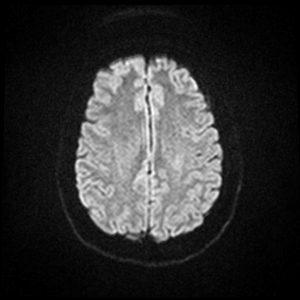
[im 50/50]
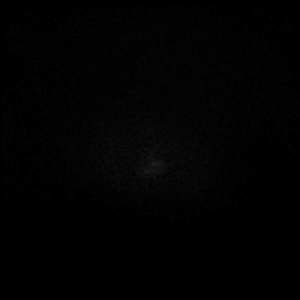

[Series 5: DWI · axial · 3.0mm · 0.77mm/px · z∈[-63,+80]mm · 4 of 50 slices shown (2 of 6)]
[im 1/50]
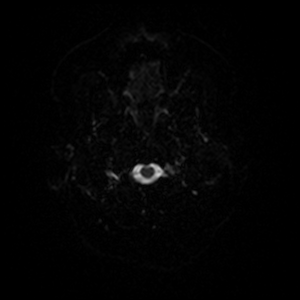
[im 17/50]
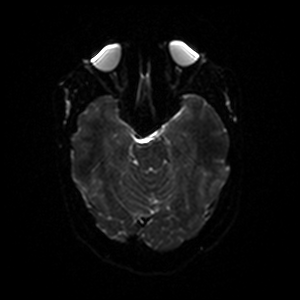
[im 33/50]
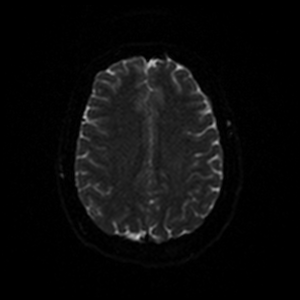
[im 50/50]
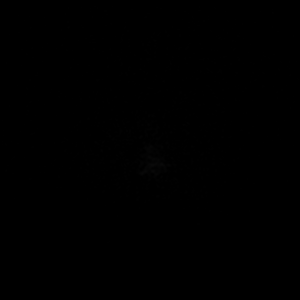

[Series 6: DWI · axial · 3.0mm · 0.77mm/px · z∈[-63,+80]mm · 3 of 50 slices shown (3 of 6)]
[im 1/50]
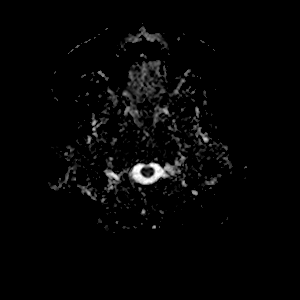
[im 25/50]
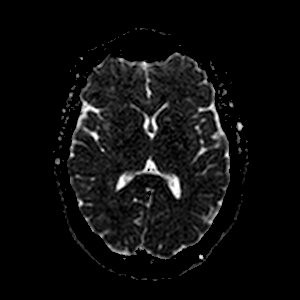
[im 50/50]
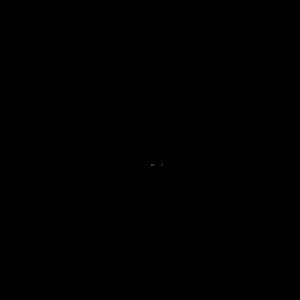

[Series 7: DWI · coronal · 5.0mm · 0.88mm/px · 1 of 28 slices shown (4 of 6)]
[im 1/28]
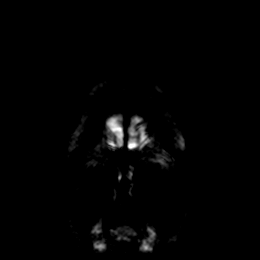

[Series 7: DWI · coronal · 5.0mm · 0.88mm/px · 1 of 28 slices shown (5 of 6)]
[im 1/28]
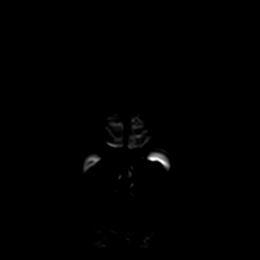

[Series 8: DWI · coronal · 5.0mm · 0.88mm/px · 1 of 28 slices shown (6 of 6)]
[im 1/28]
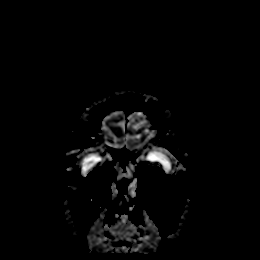

[Series 9: T1 · sagittal · 5.0mm · 0.75mm/px · 1 of 21 slices shown (1 of 2)]
[im 1/21]
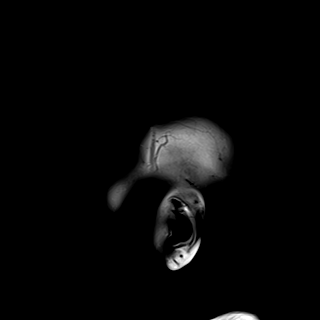

[Series 10: T2 · axial · 5.0mm · 0.72mm/px · 1 of 23 slices shown (1 of 2)]
[im 1/23]
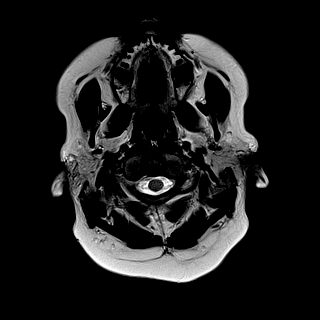

[Series 11: mag_images · axial · 3.0mm · 0.90mm/px · z∈[-78,+95]mm · 3 of 60 slices shown]
[im 1/60]
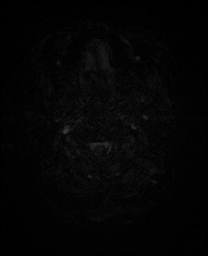
[im 30/60]
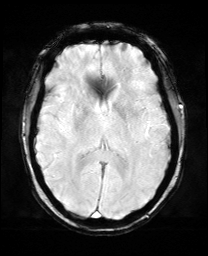
[im 60/60]
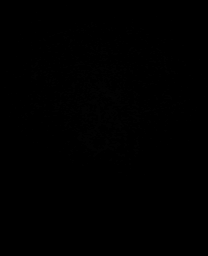

[Series 12: pha_images · axial · 3.0mm · 0.90mm/px · z∈[-78,+95]mm · 3 of 55 slices shown]
[im 1/55]
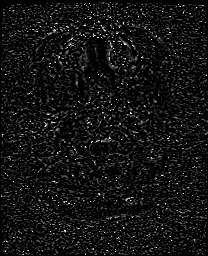
[im 28/55]
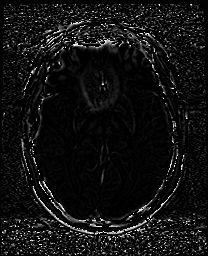
[im 55/55]
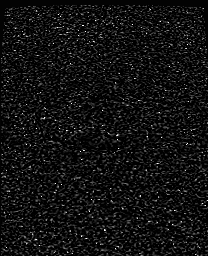

[Series 13: swi_images · axial · 3.0mm · 0.90mm/px · z∈[-78,+95]mm · 3 of 60 slices shown]
[im 1/60]
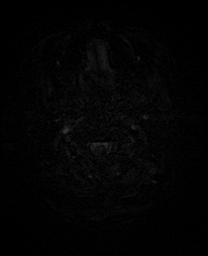
[im 30/60]
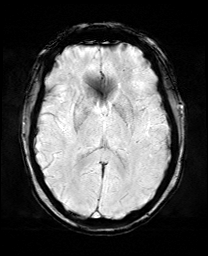
[im 60/60]
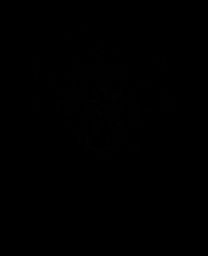

[Series 15: FLAIR · axial · 3.0mm · 0.45mm/px · z∈[-63,+80]mm · 3 of 50 slices shown]
[im 1/50]
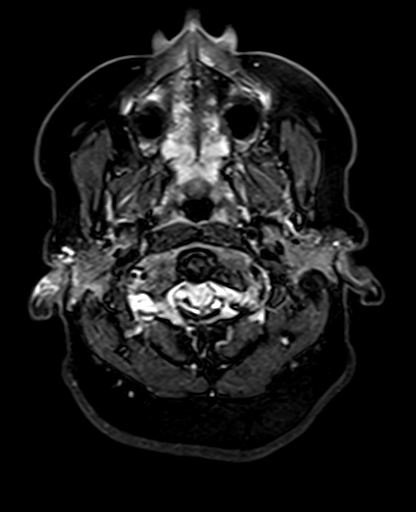
[im 25/50]
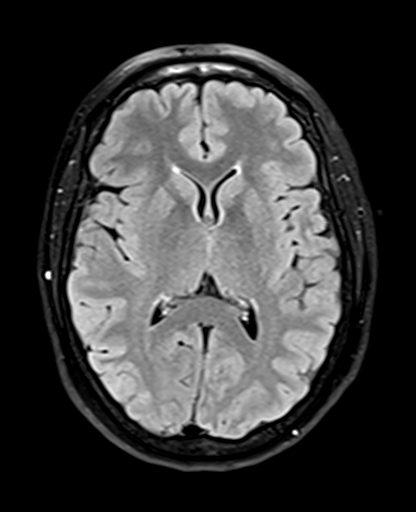
[im 50/50]
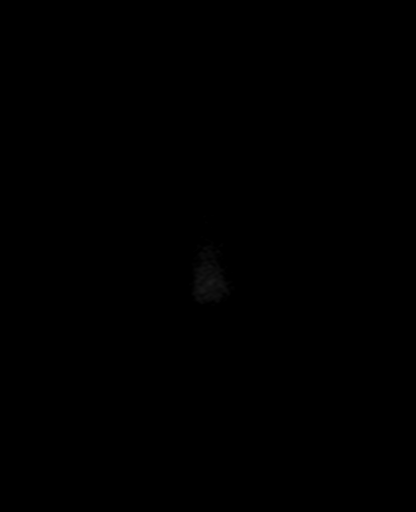

[Series 16: T1 · axial · 1.0mm · 0.98mm/px · z∈[-79,+91]mm · 9 of 175 slices shown (2 of 2)]
[im 1/175]
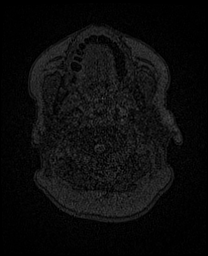
[im 22/175]
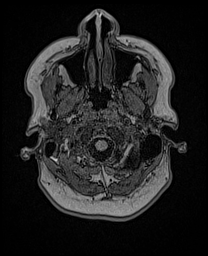
[im 44/175]
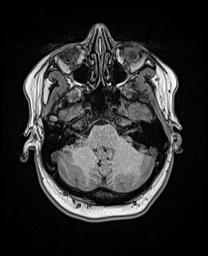
[im 66/175]
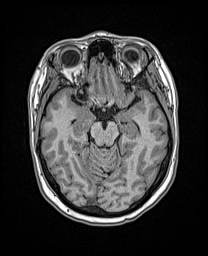
[im 88/175]
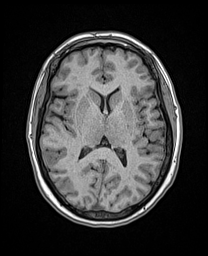
[im 109/175]
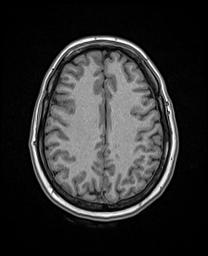
[im 131/175]
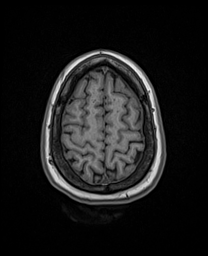
[im 153/175]
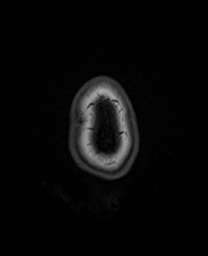
[im 175/175]
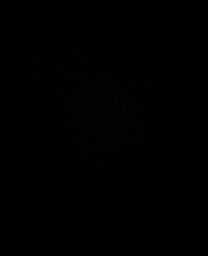

[Series 17: T2 · coronal · 5.0mm · 0.72mm/px · 1 of 28 slices shown (2 of 2)]
[im 1/28]
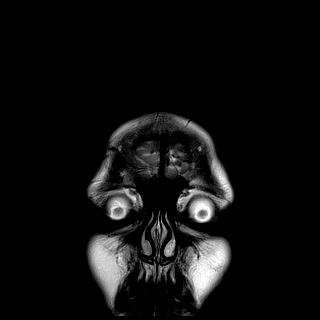

[Series 19: T1 post-contrast · coronal · 5.0mm · 0.34mm/px · 1 of 28 slices shown]
[im 1/28]
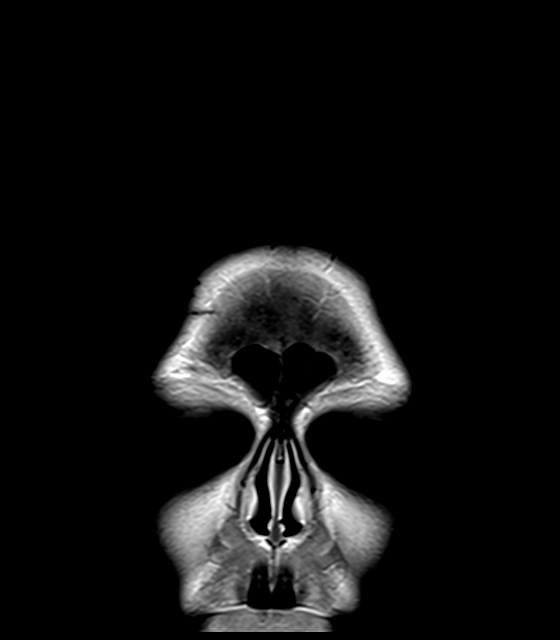

[39 of 48 positions shown; findings below may reference images not displayed]

FINDINGS: Brain:

Cerebral volume is normal.

1-2 mm nodular appearing focus of enhancement within the left
internal auditory canal (series 18, image 43).

No cortical encephalomalacia is identified. No significant cerebral
white matter disease.

There is no acute infarct.

No chronic intracranial blood products.

No extra-axial fluid collection.

No midline shift.

Vascular: Maintained flow voids within the proximal large arterial
vessels.

Skull and upper cervical spine: No focal suspicious marrow lesion.

Sinuses/Orbits: No mass or acute finding within the imaged orbits.
Trace mucosal thickening within the right sphenoid sinus.

Impression #1 will be called to the ordering clinician or
representative by the Radiologist Assistant, and communication
documented in the PACS or [REDACTED].
IMPRESSION: 1-2 mm nodular appearing focus of enhancement within the left
internal auditory canal. While this may reflect asymmetric vascular
enhancement, an internal auditory canal protocol brain MRI without
and with contrast is recommended to exclude a small vestibular
schwannoma at this site.

Otherwise unremarkable MRI appearance of the brain.
